# Patient Record
Sex: Female | Born: 1970 | Race: White | Hispanic: No | Marital: Married | Smoking: Never smoker
Health system: Southern US, Community
[De-identification: ages and names within clinical notes are randomized; demographics above are authoritative.]

## PROBLEM LIST (undated history)

## (undated) ENCOUNTER — Inpatient Hospital Stay (HOSPITAL_COMMUNITY): Payer: PRIVATE HEALTH INSURANCE

## (undated) DIAGNOSIS — G44009 Cluster headache syndrome, unspecified, not intractable: Secondary | ICD-10-CM

## (undated) DIAGNOSIS — E7800 Pure hypercholesterolemia, unspecified: Secondary | ICD-10-CM

## (undated) DIAGNOSIS — I471 Supraventricular tachycardia, unspecified: Secondary | ICD-10-CM

## (undated) DIAGNOSIS — B009 Herpesviral infection, unspecified: Secondary | ICD-10-CM

## (undated) DIAGNOSIS — IMO0002 Reserved for concepts with insufficient information to code with codable children: Secondary | ICD-10-CM

## (undated) HISTORY — PX: OTHER SURGICAL HISTORY: SHX169

## (undated) HISTORY — DX: Reserved for concepts with insufficient information to code with codable children: IMO0002

## (undated) HISTORY — DX: Pure hypercholesterolemia, unspecified: E78.00

## (undated) HISTORY — PX: DILATION AND CURETTAGE OF UTERUS: SHX78

## (undated) HISTORY — PX: CARDIAC CATHETERIZATION: SHX172

## (undated) HISTORY — DX: Supraventricular tachycardia: I47.1

## (undated) HISTORY — PX: COLPOSCOPY: SHX161

## (undated) HISTORY — DX: Cluster headache syndrome, unspecified, not intractable: G44.009

## (undated) HISTORY — PX: KNEE SURGERY: SHX244

## (undated) HISTORY — DX: Supraventricular tachycardia, unspecified: I47.10

## (undated) HISTORY — DX: Herpesviral infection, unspecified: B00.9

---

## 1997-12-23 ENCOUNTER — Other Ambulatory Visit: Admission: RE | Admit: 1997-12-23 | Discharge: 1997-12-23 | Payer: Self-pay | Admitting: Gynecology

## 1998-06-12 DIAGNOSIS — IMO0002 Reserved for concepts with insufficient information to code with codable children: Secondary | ICD-10-CM

## 1998-06-12 HISTORY — DX: Reserved for concepts with insufficient information to code with codable children: IMO0002

## 1998-08-17 ENCOUNTER — Other Ambulatory Visit: Admission: RE | Admit: 1998-08-17 | Discharge: 1998-08-17 | Payer: Self-pay | Admitting: Gynecology

## 1998-09-21 ENCOUNTER — Other Ambulatory Visit: Admission: RE | Admit: 1998-09-21 | Discharge: 1998-09-21 | Payer: Self-pay | Admitting: Gynecology

## 1999-02-03 ENCOUNTER — Other Ambulatory Visit: Admission: RE | Admit: 1999-02-03 | Discharge: 1999-02-03 | Payer: Self-pay | Admitting: Gynecology

## 1999-08-18 ENCOUNTER — Other Ambulatory Visit: Admission: RE | Admit: 1999-08-18 | Discharge: 1999-08-18 | Payer: Self-pay | Admitting: Gynecology

## 2000-01-04 ENCOUNTER — Other Ambulatory Visit: Admission: RE | Admit: 2000-01-04 | Discharge: 2000-01-04 | Payer: Self-pay | Admitting: Gynecology

## 2000-07-20 ENCOUNTER — Inpatient Hospital Stay (HOSPITAL_COMMUNITY): Admission: AD | Admit: 2000-07-20 | Discharge: 2000-07-22 | Payer: Self-pay | Admitting: Gynecology

## 2000-08-31 ENCOUNTER — Other Ambulatory Visit: Admission: RE | Admit: 2000-08-31 | Discharge: 2000-08-31 | Payer: Self-pay | Admitting: Gynecology

## 2001-06-27 ENCOUNTER — Other Ambulatory Visit: Admission: RE | Admit: 2001-06-27 | Discharge: 2001-06-27 | Payer: Self-pay | Admitting: Gynecology

## 2002-02-27 ENCOUNTER — Other Ambulatory Visit: Admission: RE | Admit: 2002-02-27 | Discharge: 2002-02-27 | Payer: Self-pay | Admitting: Gynecology

## 2002-09-19 ENCOUNTER — Inpatient Hospital Stay (HOSPITAL_COMMUNITY): Admission: AD | Admit: 2002-09-19 | Discharge: 2002-09-21 | Payer: Self-pay | Admitting: Gynecology

## 2002-11-05 ENCOUNTER — Other Ambulatory Visit: Admission: RE | Admit: 2002-11-05 | Discharge: 2002-11-05 | Payer: Self-pay | Admitting: Gynecology

## 2003-11-11 ENCOUNTER — Other Ambulatory Visit: Admission: RE | Admit: 2003-11-11 | Discharge: 2003-11-11 | Payer: Self-pay | Admitting: Gynecology

## 2004-12-02 ENCOUNTER — Ambulatory Visit: Payer: Self-pay | Admitting: Internal Medicine

## 2004-12-09 ENCOUNTER — Other Ambulatory Visit: Admission: RE | Admit: 2004-12-09 | Discharge: 2004-12-09 | Payer: Self-pay | Admitting: Gynecology

## 2004-12-23 ENCOUNTER — Ambulatory Visit: Payer: Self-pay | Admitting: Internal Medicine

## 2004-12-28 ENCOUNTER — Ambulatory Visit (HOSPITAL_COMMUNITY): Admission: AD | Admit: 2004-12-28 | Discharge: 2004-12-30 | Payer: Self-pay | Admitting: Internal Medicine

## 2004-12-29 ENCOUNTER — Ambulatory Visit: Payer: Self-pay | Admitting: Internal Medicine

## 2005-01-12 ENCOUNTER — Ambulatory Visit: Payer: Self-pay | Admitting: Internal Medicine

## 2005-01-27 ENCOUNTER — Ambulatory Visit: Payer: Self-pay | Admitting: Internal Medicine

## 2005-12-10 IMAGING — CT CT ABDOMEN W/O CM
1 series · 15 of 32 positions shown, 19 images · non-contrast
Comparison: None

ABDOMEN CT WITHOUT CONTRAST:

CLINICAL DATA: Status post catheterization, right groin pain
TECHNIQUE: Multidetector CT imaging of the abdomen and pelvis was performed
following the standard protocol without oral or intravenous contrast.

[Series 2: routine abdomen · axial · 0.67mm/px · z∈[-322,+28]mm · 15 of 79 slices shown, 19 images]
[im 6/79  soft-tissue]
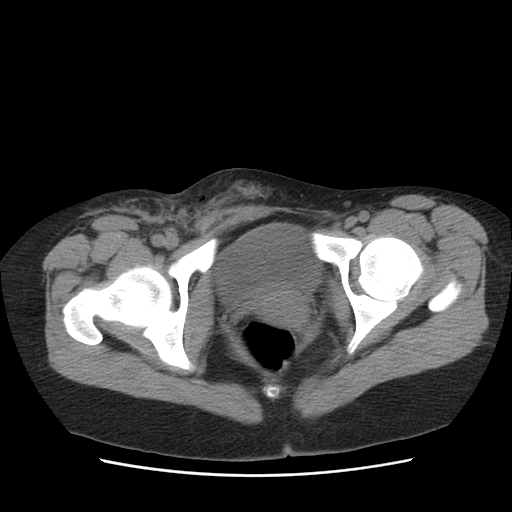
[im 6/79  bone]
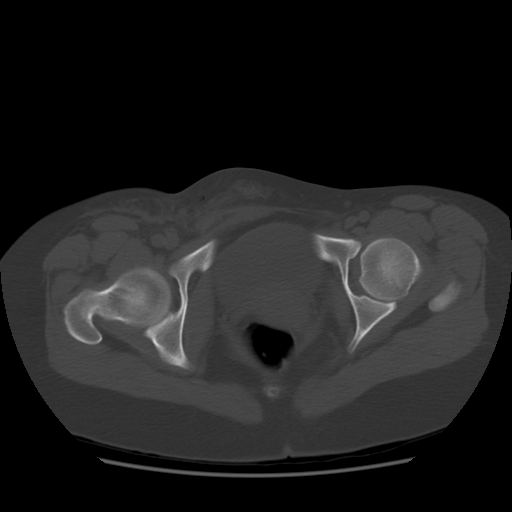
[im 11/79  soft-tissue]
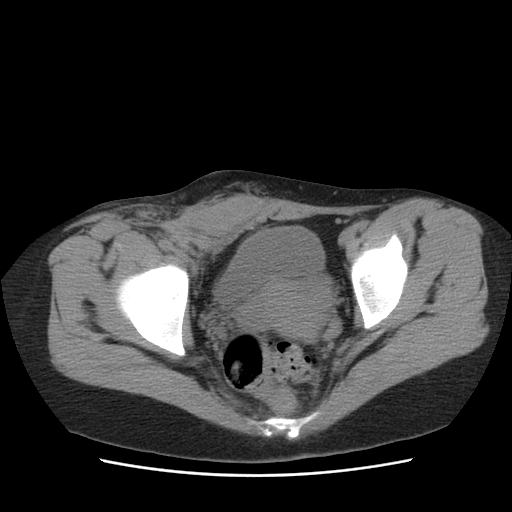
[im 16/79  soft-tissue]
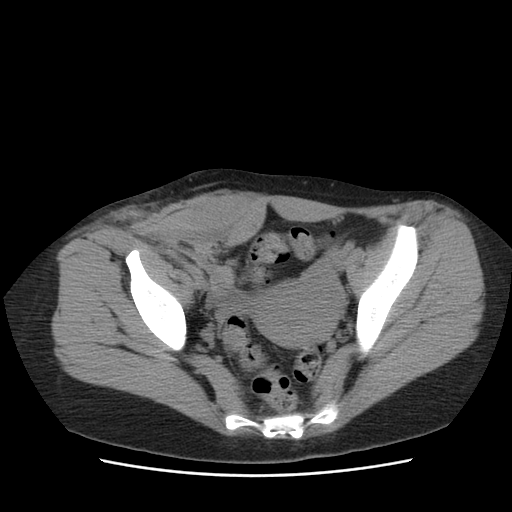
[im 23/79  soft-tissue]
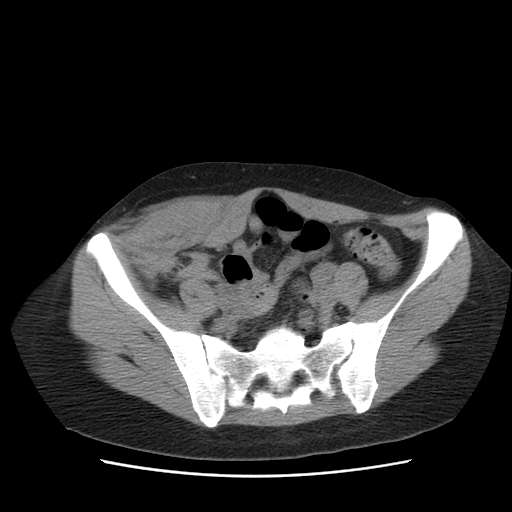
[im 28/79  soft-tissue]
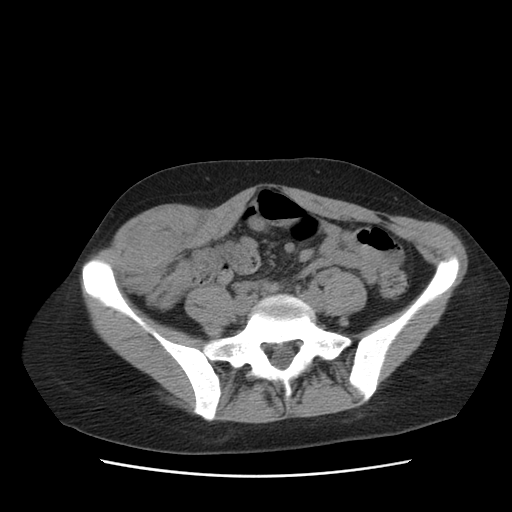
[im 33/79  soft-tissue]
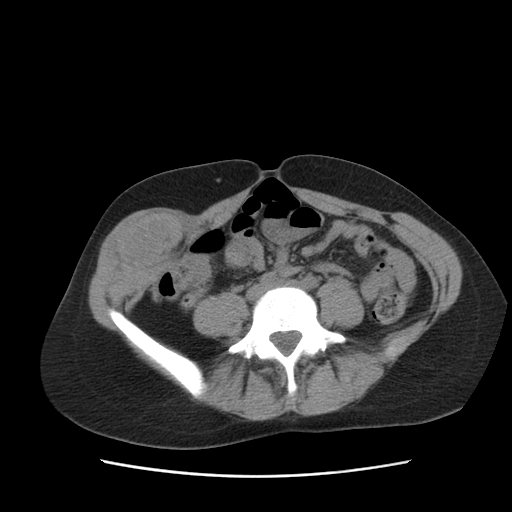
[im 41/79  soft-tissue]
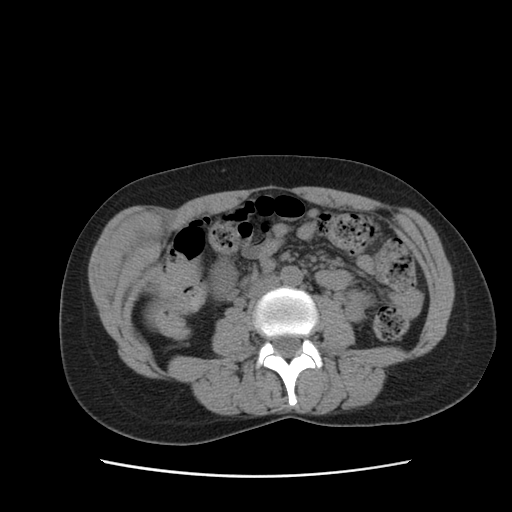
[im 46/79  soft-tissue]
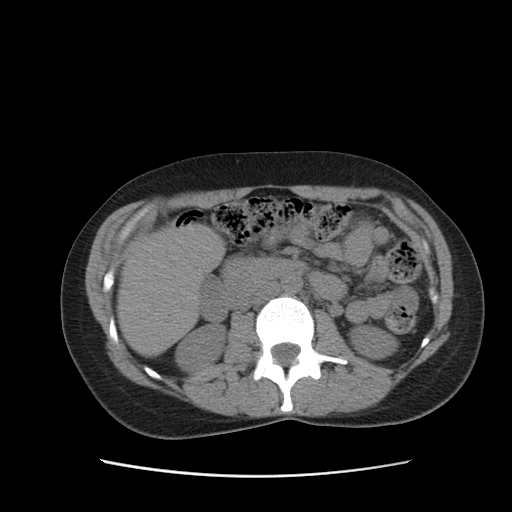
[im 51/79  soft-tissue]
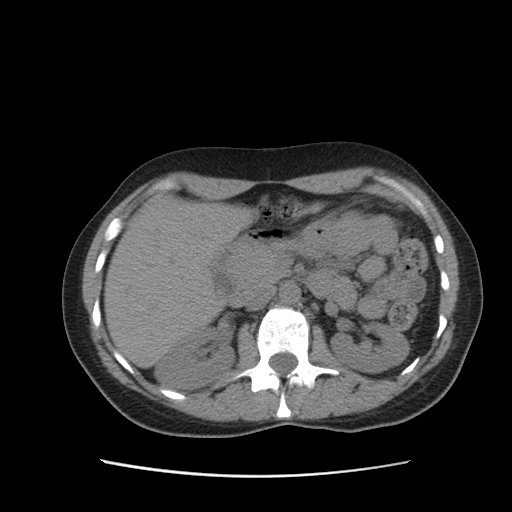
[im 51/79  bone]
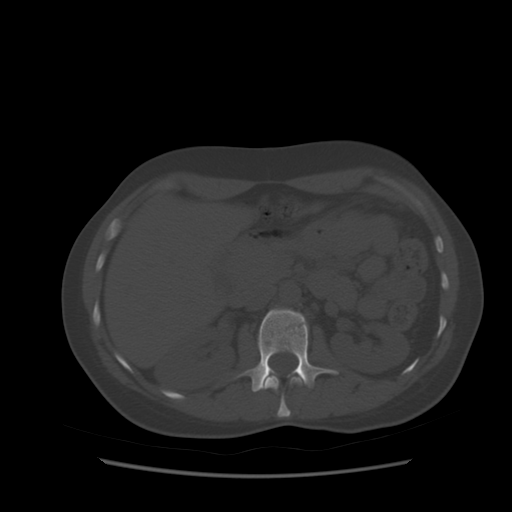
[im 56/79  soft-tissue]
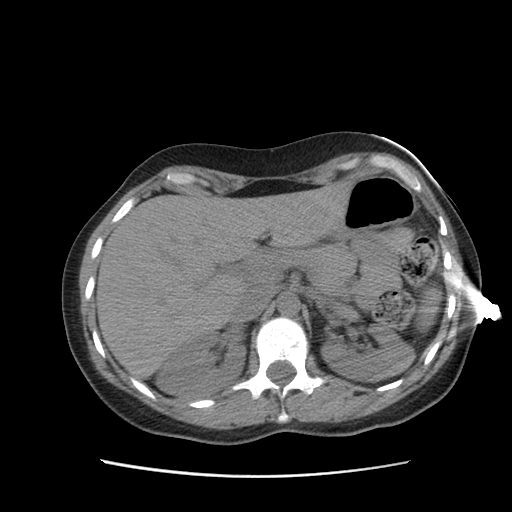
[im 63/79  soft-tissue]
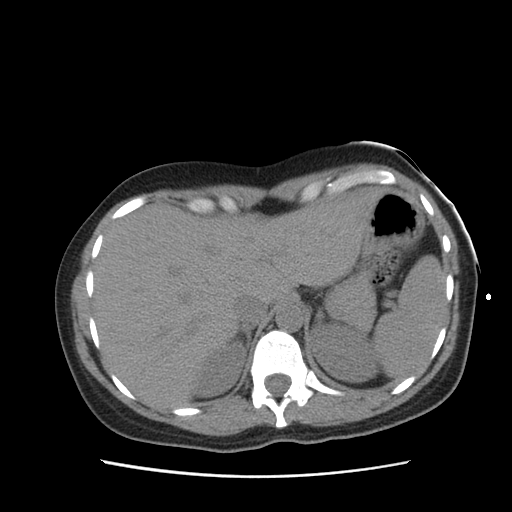
[im 68/79  soft-tissue]
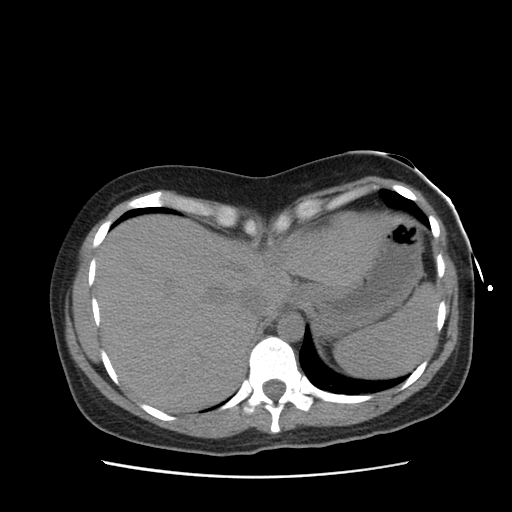
[im 68/79  lung]
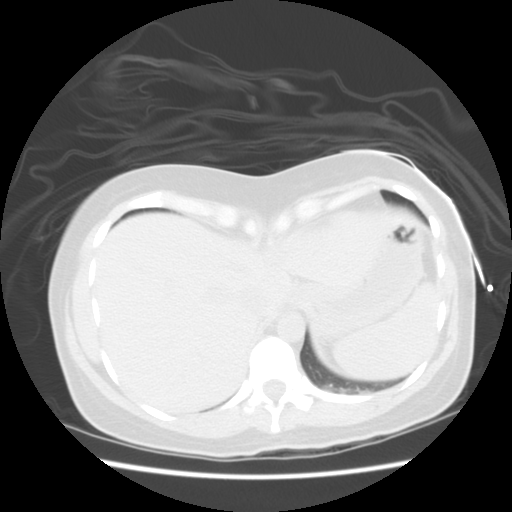
[im 71/79  lung]
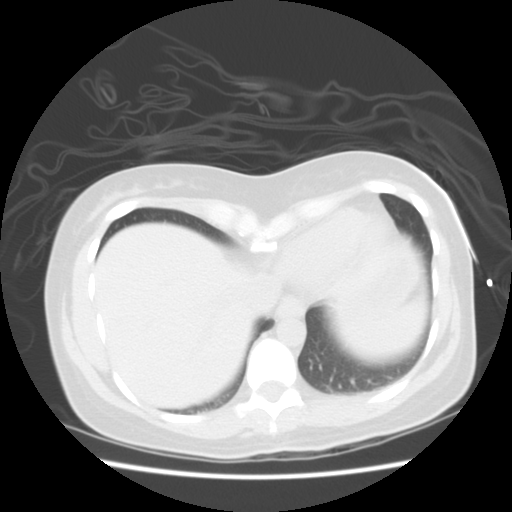
[im 73/79  soft-tissue]
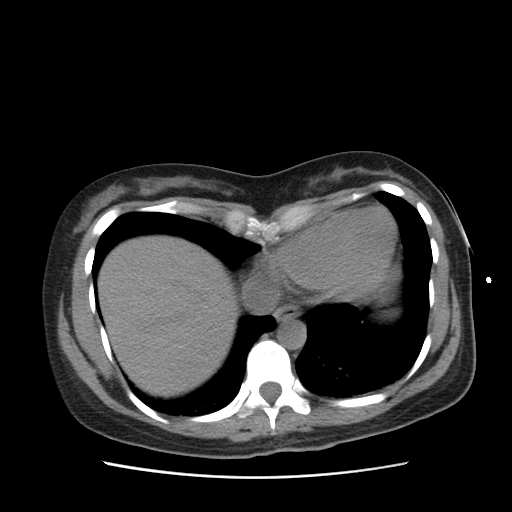
[im 73/79  lung]
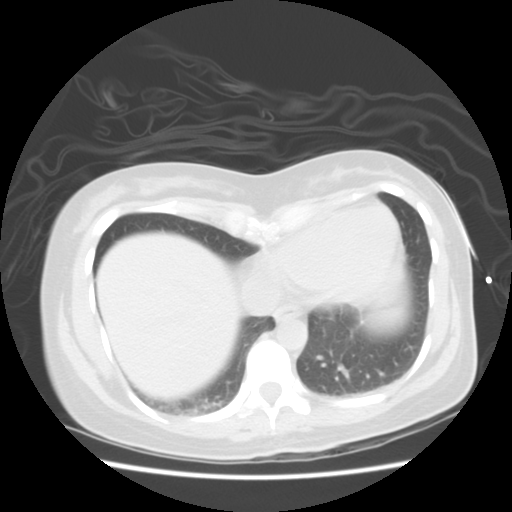
[im 76/79  lung]
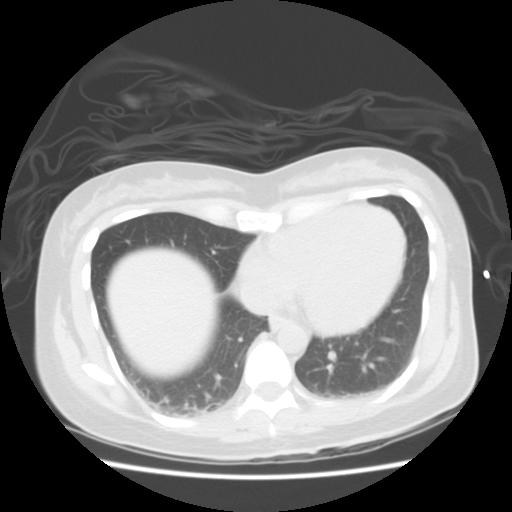

[15 of 32 positions shown; findings below may reference images not displayed]

FINDINGS: There is a hematoma noted within the right external oblique muscle
beginning just below the right inferior rib margin extending into the pelvis. No
retroperitoneal bleed. The solid organs have unremarkable uninfused appearance.
No free fluid or free air. Bowel and gallbladder are grossly unremarkable.
IMPRESSION: Hematoma noted within the right external oblique muscle extending into the
pelvis. 

PELVIS CT WITHOUT CONTRAST:
FINDINGS: The hematoma within the right external oblique muscle continues down
to near the pubic symphysis. Stranding is noted within the subcutaneous soft
tissues of the right groin. No free fluid in the pelvis. Pelvic structures
unremarkable.
IMPRESSION: Hematoma within the right external oblique muscle. Stranding in the subcutaneous
soft tissues of the right groin.

These results were discussed with Egger with [facility].

## 2006-01-03 ENCOUNTER — Other Ambulatory Visit: Admission: RE | Admit: 2006-01-03 | Discharge: 2006-01-03 | Payer: Self-pay | Admitting: Gynecology

## 2007-01-16 ENCOUNTER — Other Ambulatory Visit: Admission: RE | Admit: 2007-01-16 | Discharge: 2007-01-16 | Payer: Self-pay | Admitting: Gynecology

## 2007-02-03 ENCOUNTER — Inpatient Hospital Stay (HOSPITAL_COMMUNITY): Admission: AD | Admit: 2007-02-03 | Discharge: 2007-02-03 | Payer: Self-pay | Admitting: Obstetrics and Gynecology

## 2008-01-12 ENCOUNTER — Ambulatory Visit (HOSPITAL_COMMUNITY): Admission: RE | Admit: 2008-01-12 | Discharge: 2008-01-12 | Payer: Self-pay | Admitting: Gynecology

## 2008-02-12 ENCOUNTER — Encounter: Payer: Self-pay | Admitting: Gynecology

## 2008-02-12 ENCOUNTER — Ambulatory Visit: Payer: Self-pay | Admitting: Gynecology

## 2008-02-12 ENCOUNTER — Other Ambulatory Visit: Admission: RE | Admit: 2008-02-12 | Discharge: 2008-02-12 | Payer: Self-pay | Admitting: Gynecology

## 2008-11-22 ENCOUNTER — Emergency Department (HOSPITAL_BASED_OUTPATIENT_CLINIC_OR_DEPARTMENT_OTHER): Admission: EM | Admit: 2008-11-22 | Discharge: 2008-11-22 | Payer: Self-pay | Admitting: Emergency Medicine

## 2008-11-22 ENCOUNTER — Ambulatory Visit: Payer: Self-pay | Admitting: Interventional Radiology

## 2009-02-17 ENCOUNTER — Encounter: Payer: Self-pay | Admitting: Gynecology

## 2009-02-17 ENCOUNTER — Other Ambulatory Visit: Admission: RE | Admit: 2009-02-17 | Discharge: 2009-02-17 | Payer: Self-pay | Admitting: Gynecology

## 2009-02-17 ENCOUNTER — Ambulatory Visit: Payer: Self-pay | Admitting: Gynecology

## 2009-03-27 ENCOUNTER — Ambulatory Visit (HOSPITAL_COMMUNITY): Admission: RE | Admit: 2009-03-27 | Discharge: 2009-03-27 | Payer: Self-pay | Admitting: Gynecology

## 2009-05-30 ENCOUNTER — Emergency Department (HOSPITAL_BASED_OUTPATIENT_CLINIC_OR_DEPARTMENT_OTHER): Admission: EM | Admit: 2009-05-30 | Discharge: 2009-05-30 | Payer: Self-pay | Admitting: Emergency Medicine

## 2009-05-30 ENCOUNTER — Ambulatory Visit: Payer: Self-pay | Admitting: Diagnostic Radiology

## 2010-03-19 ENCOUNTER — Ambulatory Visit (HOSPITAL_COMMUNITY): Admission: RE | Admit: 2010-03-19 | Discharge: 2010-03-19 | Payer: Self-pay | Admitting: Gynecology

## 2010-03-24 ENCOUNTER — Ambulatory Visit: Payer: Self-pay | Admitting: Gynecology

## 2010-05-09 ENCOUNTER — Ambulatory Visit: Payer: Self-pay | Admitting: Gynecology

## 2010-05-12 IMAGING — CR DG CHEST 2V
2 series · 2 of 2 positions shown · non-contrast
Comparison: None.

CLINICAL DATA: Cough, shortness of breath, chest pain and back
pain.

CHEST - 2 VIEW

[w chest pa]
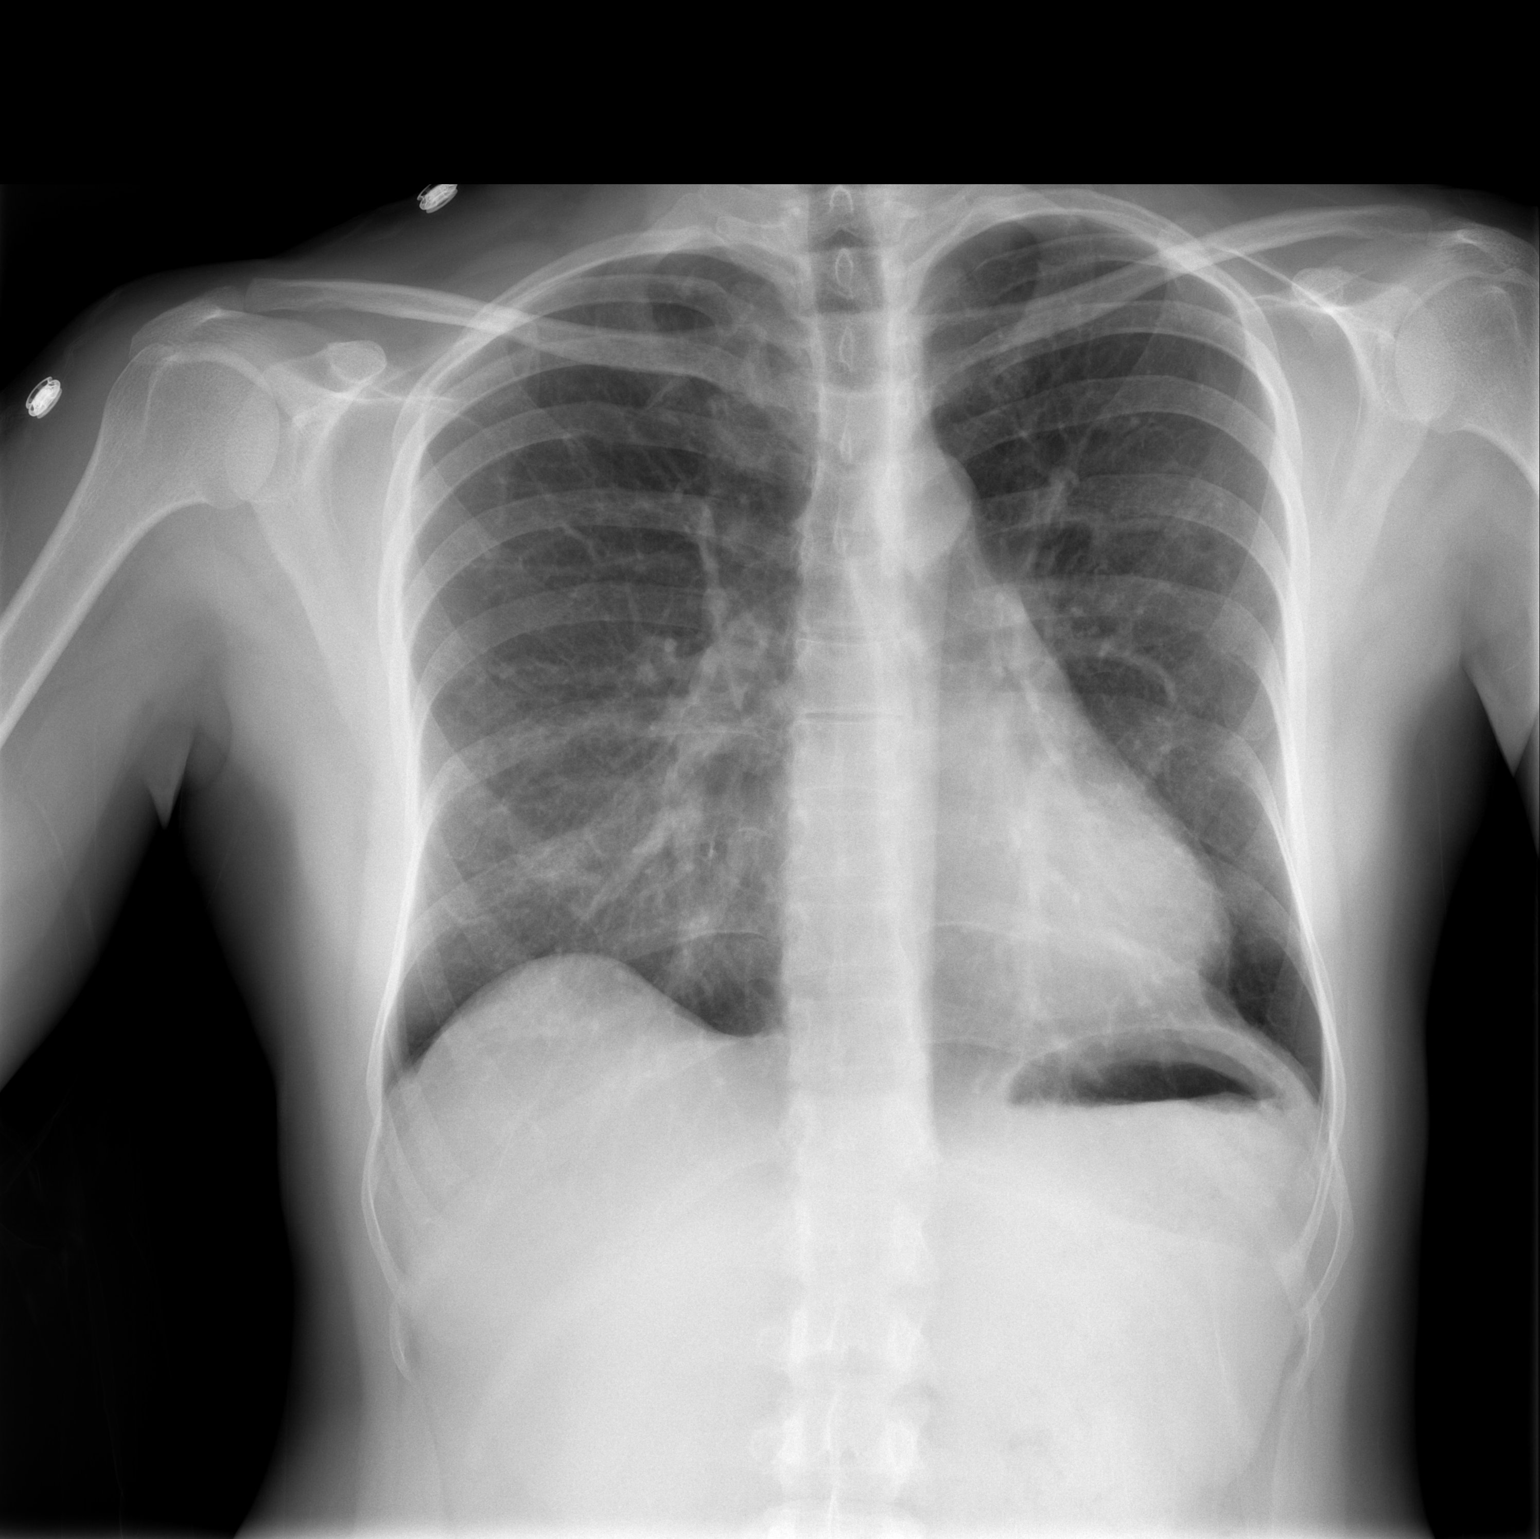

[w chest lat]
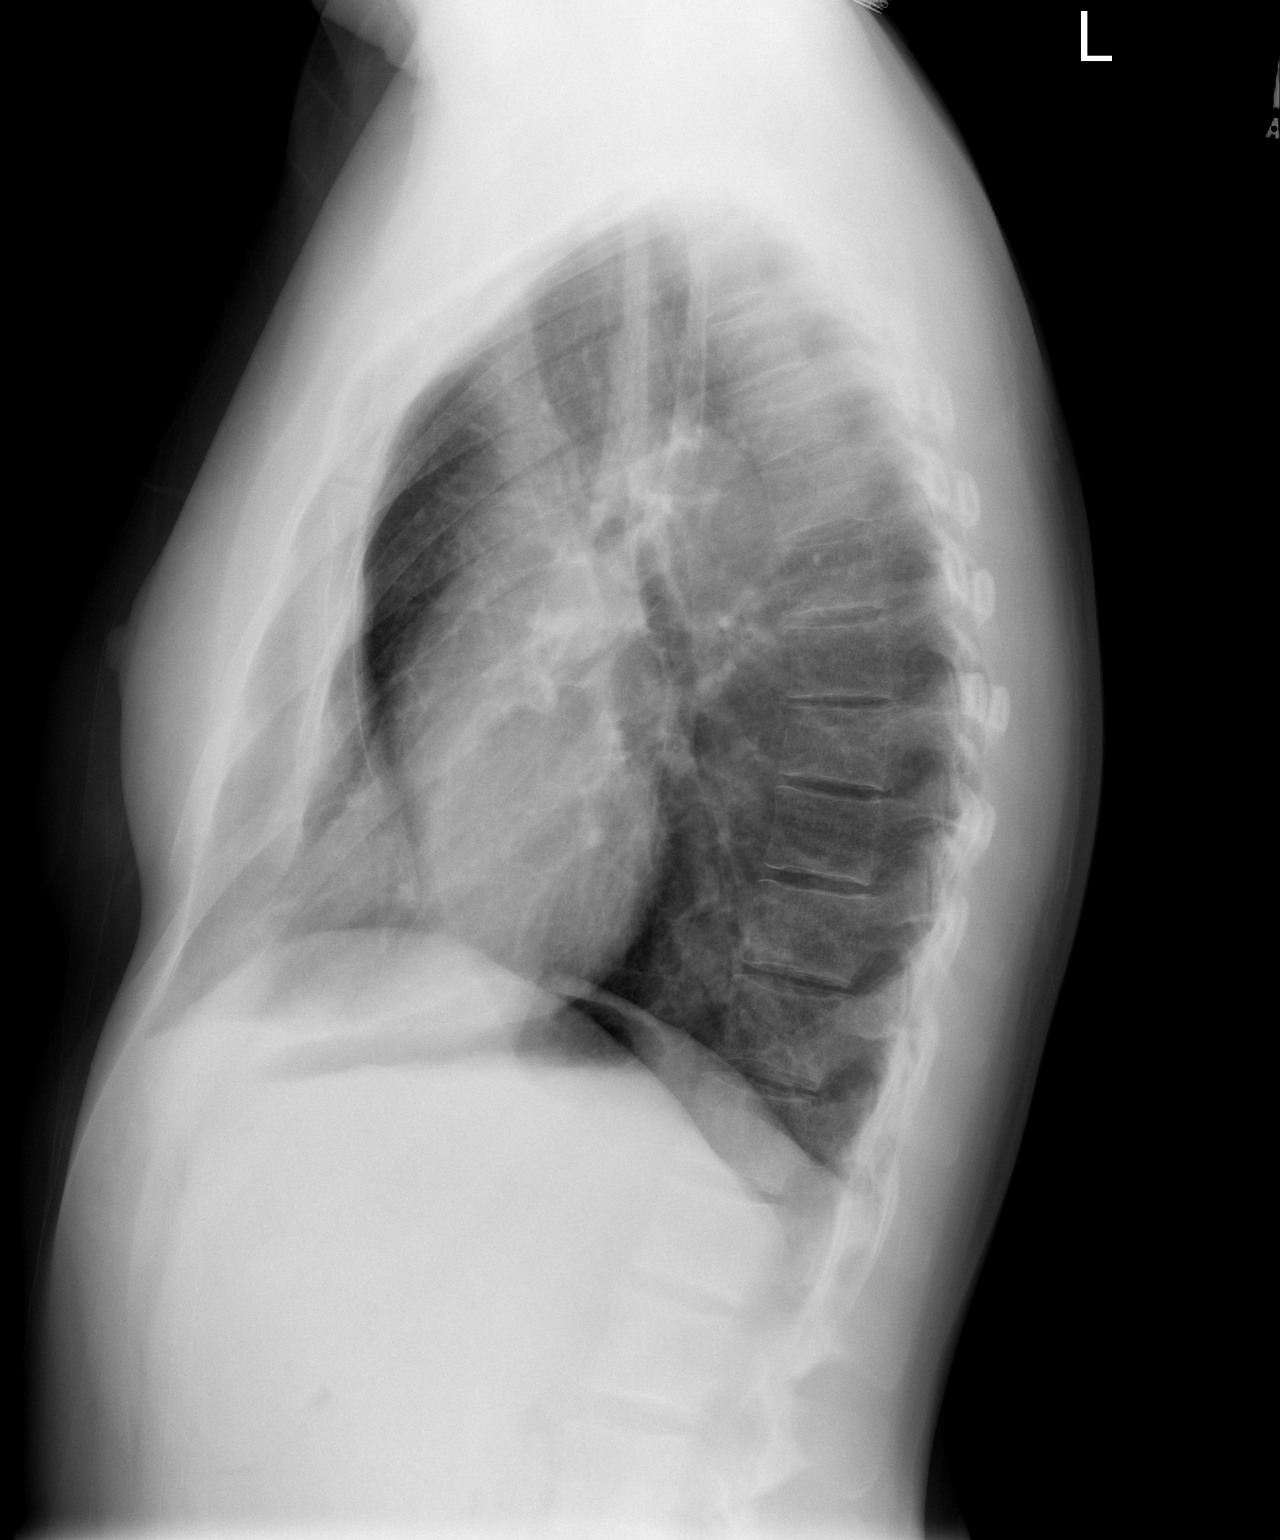

[2 of 2 positions shown; findings below may reference images not displayed]

FINDINGS: Trachea is midline.  Heart size normal.  Lungs are clear.
No pleural fluid.  Pectus deformity is noted.
IMPRESSION: No acute findings.

## 2010-10-28 NOTE — H&P (Signed)
[facility]  Patient:    Robin Potter, Robin Potter                       MRN: [id number] Adm. Date:  07/20/00 Attending:  Marcial Pacas P. Fontaine, M.D.                         History and Physical  CHIEF COMPLAINT:              1. Pregnancy at term.                               2. 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                               3. Advanced cervical change.  HISTORY OF PRESENT ILLNESS:   A 40 year old, G3, P1, AB1, female at [redacted] weeks gestation found on recent exam to be 3 cm, 50%, -1 station.  The patient has a history of HSV, no recent outbreaks, and is admitted for induction due to increasing maternal discomfort as well as to achieve an HSV-free window.  PAST MEDICAL HISTORY:         Uncomplicated.  PAST SURGICAL HISTORY:        Includes wisdom teeth extraction and D&C.  ALLERGIES:                    SULFA.  REVIEW OF SYSTEMS:            Noncontributory.  SOCIAL HISTORY:               Noncontributory.  FAMILY HISTORY:               Noncontributory.  ADMISSION PHYSICAL EXAMINATION:  VITAL SIGNS:                  Afebrile.  Vital signs are stable.  HEENT:                        Normal.  LUNGS:                        Clear.  CARDIAC:                      Regular rate.  No rubs, murmurs, or gallops.  ABDOMEN:                      Gravid vertex fetus.  PELVIC:                       At 3 cm, 50% effaced, -1 station.  ASSESSMENT:                   A 40 year old, gravida 3, para 1, abortus 1 female at term gestation with advanced cervical changed for induction due to increasing maternal discomfort as well as to achieve an herpes simplex virus-free window.  The patient is beta Streptococcus negative.  Plan is for Pitocin augmentation, artificial rupture of membranes, and  delivery as discussed with the patient and her husband, both of whom agree. DD:  07/18/00 TD:  07/18/00 Job:  [id number]

## 2010-10-28 NOTE — Op Note (Signed)
Robin Potter, Robin Potter NO.:  [id number]   MEDICAL RECORD NO.:  [id number]          PATIENT TYPE:  OIB   LOCATION:  2899                         FACILITY:  [facility]   PHYSICIAN:  Doylene Canning. Ladona Ridgel, M.D.  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   INTRODUCTION:  The patient is a 40 year old woman with a several year  history of tachy palpitations. They start and stop suddenly.  They sometimes  resolve with vagal maneuvers. She has not had good control with medical  therapy. She is now referred for electrophysiologic study and catheter  ablation.  Of note, the patient has had documented SVT at rates of over 200  beats a minute.   PROCEDURE:  After informed consent was obtained, the patient was taken to  the diagnostic EP lab in fasting state.  After the usual preparation and  draping, intravenous fentanyl and Midazolam was given for sedation. A 6-  [place] hexapolar catheter was inserted percutaneously in the right jugular  vein and advanced to the coronary sinus. A 5-French quadripolar catheter was  inserted percutaneously in the right femoral vein and advanced to the His  bundle region. A 5-French quadripolar catheter was inserted percutaneously  in the right femoral vein and advanced to the right ventricle. After  measurement of the basic intervals, rapid ventricular pacing was carried out  from the RV apex at base drive cycle length of 191 milliseconds. During  rapid ventricular pacing, the atrial activation was nondecremental and  eccentric with earliest atrial activation along the distal coronary sinus  approximately three to four o'clock on the mitral annulus when viewed in the  LAO projection. Additional pacing down to 380  milliseconds resulted in the  initiation of SVT. During SVT the cycle length was 450 milliseconds. PVCs  placed the time of His bundle refractoriness clearly pre-excited the atrium.  The atrial activation during SVT was earliest in the distal coronary sinus.  V pacing terminated the tachycardia. With all the above documented,  additional rapid atrial pacing was carried out from the coronary sinus and  this demonstrated no pre-excitation despite pacing from this position  immediately next to the accessory pathway. Rapid atrial pacing was carried  out from the coronary sinus as well as the high right atrium and stepwise  decreased down to 450 milliseconds where AV Wenckebach was observed. During  rapid atrial pacing, the PR interval became equal to the RR interval but  there was no inducible SVT. Programmed atrial stimulation was carried out  from the coronary sinus as well as the high right atrium at base drive cycle  length of 478 milliseconds.  The S1-S2 interval stepwise decreased from 540  milliseconds down to 310 milliseconds where the AV node ERP was observed.  During programmed atrial stimulation there was an AH jump and a rare echo  beat noted but  no inducible SVT. At this point the ablation catheter was  inserted percutaneously through the right femoral artery and advanced  retrograde across the aortic valve into the left ventricle. The ablation  catheter was flipped onto the mitral annulus. Mapping was carried out. At a  site at approximately 4 o'clock on the mitral annulus, the local V and A on  the ablation catheter were fused together during ventricular pacing.  At  this location a single RF energy application was delivered resulting in  termination of accessory pathway conduction. Following this, additional  rapid atrial and ventricular pacing was carried out and rapid atrial pacing  and ventricular pacing was carried out during the infusion of isoproterenol.  Despite this,  there was no recurrent accessory pathway conduction and no  additional SVT. The catheters were then removed. Hemostasis was assured, the  patient was returned to her room in satisfactory condition.   COMPLICATIONS:  There were no immediate procedure complications.   RESULTS:  A.  Baseline ECG.  The baseline ECG demonstrates normal sinus  rhythm with normal axis and intervals.  B.  Baseline intervals.  Sinus node cycle length was 837 milliseconds, the  PR interval 127 milliseconds, the HV interval 42 milliseconds, QRS duration  90 milliseconds.  C.  Rapid ventricular pacing.  Rapid ventricular pacing was carried out from  the RV apex at paced cycle length of 600 milliseconds. Prior to catheter  ablation there was eccentric nondecremental atrial activation and inducible  SVT. Following ablation, there was VA dissociation present at 600  milliseconds.  D.  Programmed ventricular stimulation. Programmed ventricular stimulation  was carried out from the RV apex at base drive cycle length of 119  milliseconds.  The S1-S1 interval was stepwise decreased down to 300  milliseconds resulting in the retrograde accessory pathway ERP. During  programmed ventricular stimulation, the atrial activation was nondecremental  and eccentric. E.  Rapid atrial pacing. Rapid atrial pacing was carried out  from the coronary sinus and the high atrium at paced cycle length of 600  milliseconds and stepwise decreased down to 450 milliseconds where AV  Wenckebach was observed. During rapid atrial pacing, the PR interval was  equal to but not greater than the RR interval. There was no inducible SVT.  F.  Programmed atrial stimulation.  Programmed atrial stimulation was  carried out the coronary sinus as well as the high right atrium at basic  drive cycle length of 147 milliseconds.  The S1-S2 interval was stepwise  decreased from 540 milliseconds down to 310 milliseconds where AV node ERP was observed. During  programmed atrial stimulation there were echo beats but  no inducible SVT.  G.  Arrhythmias observed.  1.  AV reentrant tachycardia.  Initiation with rapid ventricular pacing.      Duration was sustained.  Cycle length 450 milliseconds.  Method of      termination was with ventricular pacing.      1.  Mapping. Mapping of the of the mitral annulus demonstrated the usual          size and orientation.          1.  RF energy application.  A total of one single RF energy              application was delivered which resulted in termination of              accessory pathway conduction and rendered the tachycardia  noninducible.   CONCLUSIONS:  This study demonstrates successful electrophysiologic study  and RF catheter ablation of AV re-entrant tachycardia which was utilized to  concealed left lateral accessory pathway. There were no immediate procedure  complications.       GWT/MEDQ  D:  12/28/2004  T:  12/28/2004  Job:  [id number]   cc:   Armanda Magic, M.D.  [address]  [place], [place] [address]  Fax: [phone]   Dario Guardian, M.D.  Fax: [phone]

## 2010-10-28 NOTE — H&P (Signed)
   NAME:  Robin Potter, Robin Potter                        ACCOUNT NO.:  [id number]   MEDICAL RECORD NO.:  [id number]                   PATIENT TYPE:  MAT   LOCATION:  MATC                                 [facility]ILITY:  [facility]   PHYSICIAN:  Ivor Costa. Farrel Gobble, M.D.              DATE OF BIRTH:  December 12, 1970   DATE OF ADMISSION:  DATE OF DISCHARGE:                                HISTORY & PHYSICAL   CHIEF COMPLAINT:  Term pregnancy with favorable cervix.   HISTORY OF PRESENT ILLNESS:  The patient is a 40 year old Gravida IV, Para  II, 0/1/2 with a last menstrual period of December 24, 2001. Estimated date of  confinement is Maralyn 21, 2004. Estimated gestational age is 55 and 2/7th  weeks. She presents to labor and delivery for elective induction of labor  secondary to favorable cervix status. In addition, the patient has a history  of herpes II and has not had an outbreak in the recent past. Therefore, we  elected to do an induction at this point. Her pregnancy has been complicated  only by the above. She reports good fetal movement. No vaginal bleeding. No  contractions. Otherwise, is without complaints.   OB/GYN HISTORY:  She is A+, antibody negative, Rubella immune, RPR non-  reactive, HIV non-reactive, hepatitis B surface antigen non-reactive. AFP  within normal limits. GBS negative. Please refer to the [facility] for  complete history.   PHYSICAL EXAMINATION:  GENERAL: A well appearing Gravida in no acute  distress.  VITAL SIGNS: Weight 154.5 pounds. This is a 21 pound weight gain. Blood  pressure is 120/72. Urine dip was negative.  HEART: Regular rate and rhythm.  LUNGS: Clear to auscultation and percussion.  ABDOMEN: Gravid with fundal height of 38.  GYN: She is 350 and high, although the vertex is reachable.  EXTREMITIES: Trace edema.   ASSESSMENT:  Favorable cervix at 38 plus weeks for a AROM and Pitocin. The  patient will present on the morning of Kaylinn 9, 2004 for the above.                                      Ivor Costa. Farrel Gobble, M.D.    THL/MEDQ  D:  09/18/2002  T:  09/19/2002  Job:  [id number]

## 2010-10-28 NOTE — Discharge Summary (Signed)
NAMEMAEBELL, LYVERS NO.:  [id number]   MEDICAL RECORD NO.:  [id number]          PATIENT TYPE:  OIB   LOCATION:  2012                         FACILITY:  [facility]   PHYSICIAN:  Doylene Canning. Ladona Ridgel, M.D.  DATE OF BIRTH:  1970/10/08   DATE OF ADMISSION:  12/28/2004  DATE OF DISCHARGE:  12/30/2004                                 DISCHARGE SUMMARY   DISCHARGE DIAGNOSES:  1.  Discharging day number two, status post electrophysiology      study/radiofrequency catheter ablation of atrial ventricular re-entry      tachycardia.  The patient had inducible  atrial ventricular re-entry      tachycardia by way of a concealed left lateral accessory pathway.  After      ablation, no inducible supraventricular tachycardia.  2.  Post-procedure right groin hematoma.  3.  A CT of the pelvis demonstrated a hematoma of the right external oblique      muscle, extending from just below the right inferior rib to the pubic      symphysis.  No retroperitoneal bleed.  4.  Hemoglobin stable at 9.9 for the last 24 hours.  On the morning of December 29, 2004, the hemoglobin was 9.9.  On the morning of December 30, 2004,      hemoglobin was 9.9.  5.  Hypotension post-procedure, secondary to acute blood loss anemia.  The      patient's blood pressure on admission was 103 systolic.  The patient's      blood pressure at discharge was 87.  6.  Tachy-palpitations, symptomatic, with fatigue and pre-syncope.  The      tachy-arrhythmias have always been self-limited.  The patient has never      needed hospitalization with challenge adenosine.   PROCEDURE:  On December 28, 2004, electrophysiology study with successful  radiofrequency catheter ablation of atrial ventricular re-entry tachycardia  by way of concealed left lateral accessory pathway, Dr. Doylene Canning. Ladona Ridgel,  practitioner.   DISPOSITION:  Discharging day two after radiofrequency catheter ablation  procedure.  The patient did have in the immediate  post-procedure period pain  and tenderness at the right groin, extending up to the right lower quadrant.  A CT scan was done, which showed no peritoneal bleed, but evidence of  hematoma which stretched from the right subcostal space to the pubic  symphysis.  The patient had a hemoglobin of 12.6 on preoperative blood  studies, which were on December 23, 2004, with acute blood loss anemia.  The  hemoglobin had dropped to 9.9 and has remained stable at 9.9 for the last 24  hours prior to discharge.  The patient is having some hypotension, but  achieving 99% oxygen saturation on room air.  She continues to have  tenderness and pain at the right groin and in the right lower quadrant, but  it is controlled well with Percocet, and she will go home with this  medication.   DISCHARGE INSTRUCTIONS:  1.  The patient is unable to take iron supplementation, secondary to feeling      nauseated with  this, so it is suggested that the patient eat a diet      sufficient in red meat.  2.  The patient is able to shower.  3.  She is to call [phone] if she experiences any increased pain or      increased swelling at the catheter site.  4.  She is asked not to drive for the next two days.  5.  She is not to engage in any heavy lifting for the next two weeks.   DISCHARGE MEDICATIONS:  1.  For pain she has Percocet 5/325, one to two tab q.4-6h. p.r.n.  2.  She goes home on enteric-coated aspirin 81 mg daily.  She will start      that on Monday, January 02, 2005.  3.  Of note, if she plans dental work, even just teeth cleaning up to and      through December 2006, or minor surgery, call [phone] for antibiotic      coverage.   FOLLOWUP:  1.  She will come to the [facility] on Thursday,      January 12, 2005, at 4 p.m.  2.  She will see Dr. Lewayne Bunting on Friday, January 27, 2005, at 12:15 p.m.   HISTORY OF PRESENT ILLNESS:  Ms. Cotta is a 40 year old female.  She has  a long history of  tachy palpitations.  These symptoms began when she was a  teenager.  They have increased in frequency and severity.  She has had in  the past Cardizem, Toprol and atenolol as treatment, but she has symptoms  even on these medications.  She feels fatigued and pre-syncopal when she has  heart racing.  She has recently worn a monitor, which demonstrates narrow  complex tachycardia at greater than 200 beats a minute.  The patient says  these spells last as long as two hours.  They are self-limited.  Sometimes  vagal maneuvers work.  Sometimes they do not.  She also has chest pressure  and back discomfort with these palpitations.   PAST MEDICAL HISTORY:  1.  Also notable for a history of migraine headaches for the last eight      years.  2.  History of hyperlipidemia.  Otherwise her medical history is free of any      chronic disease.   The patient has been seen by Dr. Lewayne Bunting.  The risks and benefits and  expectations of the electrophysiology study and catheter ablation have been  discussed.  The patient would like to proceed.   HOSPITAL COURSE:  The patient presented on December 28, 2004, for an elective  electrophysiology study and probable radiofrequency catheter ablation.  A  concealed left lateral accessory pathway was revealed at electrophysiology  study, and ablation carried out successfully.  This required a right femoral  artery approach, and after RF CA, there was no accessory pathway conduction  and no inducible SVT.  In the post-procedure period, the patient did develop  right groin pain which extended into the right lower quadrant.  A CT of the  pelvis as described above, showed no retroperitoneal bleed, but significant  right-sided hematoma.  The patient's hemoglobin has stabilized at 9.9.  She  discharges at that value.   She will start baby aspirin in three days, and has followup as dictated  above.      Debbora Lacrosse  GM/MEDQ  D:  12/30/2004  T:  12/30/2004  Job:   [id number]   cc:  Dario Guardian, M.D.  Fax: [phone]   Armanda Magic, M.D.  [address]  [place], [place] [address]  Fax: [phone]

## 2010-10-28 NOTE — Discharge Summary (Signed)
   NAME:  Robin Potter, Robin Potter                        ACCOUNT NO.:  [id number]   MEDICAL RECORD NO.:  [id number]                   PATIENT TYPE:  INP   LOCATION:  9122                                 [facility]ILITY[facility]   PHYSICIAN:  Ivor Costa. Farrel Gobble, M.D.              DATE OF BIRTH:  06-13-1970   DATE OF ADMISSION:  09/19/2002  DATE OF DISCHARGE:  09/21/2002                                 DISCHARGE SUMMARY   DISCHARGE DIAGNOSES:  1. Intrauterine pregnancy at 38+ weeks, delivered.  2. History of herpes simplex virus with no outbreak during pregnancy.  3. Status post spontaneous vaginal delivery.  4. Allergic reaction post delivery.   HISTORY:  A 40 year old female gravida 4, para 2 with an EDC of Donnetta 21,  2004.  Prenatal course was complicated by history of herpes type 2 with no  outbreak in the recent past.   HOSPITAL COURSE:  On Ryian 9, 2004, patient was admitted with a favorable  cervix at 38+ weeks for induction and subsequently underwent artificial  rupture of membranes.  Pitocin was started and underwent a spontaneous  vaginal delivery on Dai 9, 2004, of a female, Apgars of 9 and 9, weight 7  pounds 8 ounces.  There was no episiotomy.  First-degree laceration.  No  complications with delivery.  The patient did complain on Amyrah 10, 2004, of  a beginning redness at the buttock region, groin at the epidural site.  It  was whelpy.  No relief with Benadryl.  Anesthesiologist evaluated.  Given  topical cream.  She was started on Medrol dose pack and Claritin the day  prior.  Still itchy on Vergie 11, 2004, but was looking better, per her  husband's report.  There was noted to be erythematous rash buttocks, groin.  The patient otherwise was in satisfactory condition, thought stable for  discharge.   ACCESSORY CLINICAL FINDINGS:  Laboratories:  The patient is A+.  Rubella  immune.  On Kaydan 10, 2004, hemoglobin 10.4.   DISPOSITION:  The patient is discharged home.  She is to continue  the Medrol  Dose Pack.  She was treated with Allegra-D to take one twice a day.  She was  given __________one box, Tylox p.r.n. pain, Ambien 10 mg q.h.s. p.r.n.  sleep.  She was to call the office for followup in the a.m.  If no changes  continue to decrease the rash.  If no changes continue for improvement to  get her an allergy specialist appointment.     Susa Loffler, P.A.                    Ivor Costa. Farrel Gobble, M.D.    TSG/MEDQ  D:  10/24/2002  T:  10/24/2002  Job:  [id number]

## 2011-03-24 ENCOUNTER — Other Ambulatory Visit: Payer: Self-pay | Admitting: Gynecology

## 2011-03-24 DIAGNOSIS — Z1231 Encounter for screening mammogram for malignant neoplasm of breast: Secondary | ICD-10-CM

## 2011-03-27 ENCOUNTER — Encounter: Payer: Self-pay | Admitting: *Deleted

## 2011-03-27 DIAGNOSIS — B009 Herpesviral infection, unspecified: Secondary | ICD-10-CM | POA: Insufficient documentation

## 2011-03-27 DIAGNOSIS — I471 Supraventricular tachycardia: Secondary | ICD-10-CM | POA: Insufficient documentation

## 2011-03-27 DIAGNOSIS — E7800 Pure hypercholesterolemia, unspecified: Secondary | ICD-10-CM | POA: Insufficient documentation

## 2011-03-30 ENCOUNTER — Ambulatory Visit (INDEPENDENT_AMBULATORY_CARE_PROVIDER_SITE_OTHER): Payer: PRIVATE HEALTH INSURANCE | Admitting: Gynecology

## 2011-03-30 ENCOUNTER — Encounter: Payer: Self-pay | Admitting: Gynecology

## 2011-03-30 VITALS — BP 110/72 | Ht 61.0 in | Wt 143.0 lb

## 2011-03-30 DIAGNOSIS — Z3049 Encounter for surveillance of other contraceptives: Secondary | ICD-10-CM

## 2011-03-30 DIAGNOSIS — R82998 Other abnormal findings in urine: Secondary | ICD-10-CM

## 2011-03-30 DIAGNOSIS — Z01419 Encounter for gynecological examination (general) (routine) without abnormal findings: Secondary | ICD-10-CM

## 2011-03-30 DIAGNOSIS — N898 Other specified noninflammatory disorders of vagina: Secondary | ICD-10-CM

## 2011-03-30 DIAGNOSIS — B373 Candidiasis of vulva and vagina: Secondary | ICD-10-CM

## 2011-03-30 MED ORDER — NORETHINDRONE ACET-ETHINYL EST 1-20 MG-MCG PO TABS: 1.0000 | ORAL_TABLET | Freq: Every day | ORAL | Status: DC

## 2011-03-30 MED ORDER — FLUCONAZOLE 150 MG PO TABS: 150.0000 mg | ORAL_TABLET | Freq: Once | ORAL | Status: AC

## 2011-03-30 NOTE — Progress Notes (Signed)
Addended by: Landis Martins R on: 03/30/2011 10:30 AM   Modules accepted: Orders

## 2011-03-30 NOTE — Progress Notes (Signed)
Robin Potter Feb 16, 1971 [id number]        40 y.o.  for annual exam.  Doing well without complaints.  Past medical history,surgical history, medications, allergies, family history and social history were all reviewed and documented in the EPIC chart. ROS:  Was performed and pertinent positives and negatives are included in the history.  Exam: chaperone present Filed Vitals:   03/30/11 0857  BP: 110/72   General appearance  Normal Skin grossly normal Head/Neck normal with no cervical or supraclavicular adenopathy thyroid normal Lungs  clear Cardiac RR, without RMG Abdominal  soft, nontender, without masses, organomegaly or hernia Breasts  examined lying and sitting without masses, retractions, discharge or axillary adenopathy. Pelvic  Ext/BUS/vagina  normal with slight white discharge noted KOH wet prep done  Cervix  normal  Pap not done  Uterus  anteverted, normal size, shape and contour, midline and mobile nontender   Adnexa  Without masses or tenderness    Anus and perineum  normal   Rectovaginal  normal sphincter tone without palpated masses or tenderness.    Assessment/Plan:  40 y.o. female for annual exam.    1. White discharge. KOH was positive for yeast. She is on antibiotics for H. Pylori. Will cover with Diflucan 150x1 dose. I gave her 2 refills as she is on an extended antibiotic course indications recurrence. 2. 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 I reviewed screening guidelines currently and offered less frequent screening and she agrees with this and a Pap was not done today we'll plan on an every 3 year screening. Her last Pap was 2011 was normal. 3. Health maintenance.  Self breast exams on a monthly basis discussed and urged. Plans mammogram sometime in the next month or so. Had blood work done through her primary that was all normal and no blood work was done  today. They did not do a urinalysis I did one today. Assuming she continues well from a gynecologic standpoint then she'll see me in a year sooner as needed.    [name] MD, 9:57 AM 03/30/2011

## 2011-04-22 ENCOUNTER — Ambulatory Visit (HOSPITAL_COMMUNITY)
Admission: RE | Admit: 2011-04-22 | Discharge: 2011-04-22 | Disposition: A | Discharge status: Home / Self Care | Payer: PRIVATE HEALTH INSURANCE | Source: Ambulatory Visit | Attending: Gynecology | Admitting: Gynecology

## 2011-04-22 DIAGNOSIS — Z1231 Encounter for screening mammogram for malignant neoplasm of breast: Secondary | ICD-10-CM | POA: Insufficient documentation

## 2011-05-01 ENCOUNTER — Other Ambulatory Visit: Payer: Self-pay | Admitting: Gynecology

## 2011-05-01 DIAGNOSIS — Z3049 Encounter for surveillance of other contraceptives: Secondary | ICD-10-CM

## 2011-05-01 NOTE — Telephone Encounter (Signed)
Pt called stating pharmacy never got rx from last office visit rx called to pharmacy.

## 2011-05-03 ENCOUNTER — Other Ambulatory Visit: Payer: Self-pay | Admitting: *Deleted

## 2011-05-03 DIAGNOSIS — N6489 Other specified disorders of breast: Secondary | ICD-10-CM

## 2011-05-19 ENCOUNTER — Ambulatory Visit
Admission: RE | Admit: 2011-05-19 | Discharge: 2011-05-19 | Disposition: A | Discharge status: Home / Self Care | Payer: PRIVATE HEALTH INSURANCE | Source: Ambulatory Visit | Attending: Gynecology | Admitting: Gynecology

## 2011-05-19 DIAGNOSIS — N6489 Other specified disorders of breast: Secondary | ICD-10-CM

## 2012-02-06 ENCOUNTER — Telehealth: Payer: Self-pay | Admitting: *Deleted

## 2012-02-06 NOTE — Telephone Encounter (Signed)
Pt informed, order faxed to [phone](fax) [phone](office)

## 2012-02-06 NOTE — Telephone Encounter (Signed)
Pt called requesting order for 6 month f/u mammogram to be faxed to [facility]. Pt never returned to breast center for f/u visit. I called [facility] and was told that pt would need a diag. Mammogram & ultrasound. Please sign order and I will fax.

## 2012-02-06 NOTE — Telephone Encounter (Signed)
okay

## 2012-04-03 ENCOUNTER — Encounter: Payer: Self-pay | Admitting: Gynecology

## 2012-04-25 ENCOUNTER — Encounter: Payer: PRIVATE HEALTH INSURANCE | Admitting: Gynecology

## 2012-05-07 ENCOUNTER — Encounter: Payer: Self-pay | Admitting: Gynecology

## 2012-05-07 ENCOUNTER — Ambulatory Visit (INDEPENDENT_AMBULATORY_CARE_PROVIDER_SITE_OTHER): Payer: PRIVATE HEALTH INSURANCE | Admitting: Gynecology

## 2012-05-07 VITALS — BP 132/78 | Ht 61.25 in | Wt 151.0 lb

## 2012-05-07 DIAGNOSIS — Z13220 Encounter for screening for lipoid disorders: Secondary | ICD-10-CM

## 2012-05-07 DIAGNOSIS — Z01419 Encounter for gynecological examination (general) (routine) without abnormal findings: Secondary | ICD-10-CM

## 2012-05-07 DIAGNOSIS — Z3049 Encounter for surveillance of other contraceptives: Secondary | ICD-10-CM

## 2012-05-07 DIAGNOSIS — Z131 Encounter for screening for diabetes mellitus: Secondary | ICD-10-CM

## 2012-05-07 DIAGNOSIS — N951 Menopausal and female climacteric states: Secondary | ICD-10-CM

## 2012-05-07 LAB — COMPREHENSIVE METABOLIC PANEL
ALT: 15 U/L (ref 0–35)
AST: 19 U/L (ref 0–37)
Creat: 0.72 mg/dL (ref 0.50–1.10)
Total Bilirubin: 0.5 mg/dL (ref 0.3–1.2)

## 2012-05-07 LAB — CBC WITH DIFFERENTIAL/PLATELET
Basophils Absolute: 0.1 10*3/uL (ref 0.0–0.1)
Eosinophils Absolute: 0.2 10*3/uL (ref 0.0–0.7)
Eosinophils Relative: 3 % (ref 0–5)
Lymphocytes Relative: 25 % (ref 12–46)
MCH: 29.5 pg (ref 26.0–34.0)
MCV: 86.8 fL (ref 78.0–100.0)
Neutrophils Relative %: 61 % (ref 43–77)
Platelets: 327 10*3/uL (ref 150–400)
RDW: 12.3 % (ref 11.5–15.5)
WBC: 8.8 10*3/uL (ref 4.0–10.5)

## 2012-05-07 LAB — TSH: TSH: 1.337 u[IU]/mL (ref 0.350–4.500)

## 2012-05-07 LAB — LIPID PANEL
LDL Cholesterol: 127 mg/dL — ABNORMAL HIGH (ref 0–99)
Total CHOL/HDL Ratio: 3.7 Ratio
VLDL: 36 mg/dL (ref 0–40)

## 2012-05-07 MED ORDER — NORETHINDRONE ACET-ETHINYL EST 1-20 MG-MCG PO TABS: 1.0000 | ORAL_TABLET | Freq: Every day | ORAL | Status: DC

## 2012-05-07 NOTE — Patient Instructions (Signed)
Follow blood pressure Follow up in one year for check up Follow up with cholesterol doctor Consider seeing Dr Clarisse Gouge for headaches

## 2012-05-07 NOTE — Progress Notes (Signed)
Robin Potter 07/01/1970 [id number]        41 y.o.  [id number] for annual exam.    Past medical history,surgical history, medications, allergies, family history and social history were all reviewed and documented in the EPIC chart. ROS:  Was performed and pertinent positives and negatives are included in the history.  Exam: [name] assistant Filed Vitals:   05/07/12 0840  BP: 132/78  Height: 5' 1.25" (1.556 m)  Weight: 151 lb (68.493 kg)   General appearance  Normal Skin grossly normal Head/Neck normal with no cervical or supraclavicular adenopathy thyroid normal Lungs  clear Cardiac RR, without RMG Abdominal  soft, nontender, without masses, organomegaly or hernia Breasts  examined lying and sitting without masses, retractions, discharge or axillary adenopathy. Pelvic  Ext/BUS/vagina  normal   Cervix  normal   Uterus  anteverted, normal size, shape and contour, midline and mobile nontender   Adnexa  Without masses or tenderness    Anus and perineum  normal   Rectovaginal  normal sphincter tone without palpated masses or tenderness.    Assessment/Plan:  41 y.o. [id number] female for annual exam.   1. BCPs. Patient doing well on Loestrin 120 equivalence. 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 Does every other month withdrawal. Issues of increased risk of stroke associated with birth control pills and migraines discussed. Patient understands and accepts as well as the general increased risk of thrombosis. I gave her the name of Dr. Rubye Beach to consider further evaluation for her migraines. She has been seen in the past but it's been a while. 2. Mild elevated blood pressure. Patient just came from the gym. She is able to check at work and she will keep an eye on her blood pressures lungs is normal we'll follow. 3. Hot flashes particularly at night. Check TSH/comprehensive metabolic panel. Follow up with primary if continues. 4. History of hypercholesterolemia. Lipid profile done.  She can get these results to show her primary physician who follows her for her elevated cholesterol. 5. Mammogram.  Just had mammogram October 2013. Following area in the right breast with asymmetry with planned repeat in 6 months and she knows to do so. SBE monthly reviewed. 6. Pap smear. No Pap smear done today. Last Pap smear 2011.  History LGSIL 2000 with negative Pap smears since then. Repeat Pap smear next year a 3 year interval. 7. Health maintenance. Baseline CBC comprehensive metabolic panel lipid profile TSH and urinalysis ordered. Follow up one year, sooner as needed.    [name] MD, 9:24 AM 05/07/2012

## 2012-05-08 LAB — URINALYSIS W MICROSCOPIC + REFLEX CULTURE
Hgb urine dipstick: NEGATIVE
Leukocytes, UA: NEGATIVE
Nitrite: NEGATIVE
Protein, ur: NEGATIVE mg/dL

## 2012-07-27 ENCOUNTER — Other Ambulatory Visit: Payer: Self-pay

## 2013-03-21 ENCOUNTER — Telehealth: Payer: Self-pay | Admitting: *Deleted

## 2013-03-21 DIAGNOSIS — Z3049 Encounter for surveillance of other contraceptives: Secondary | ICD-10-CM

## 2013-03-21 MED ORDER — NORETHINDRONE ACET-ETHINYL EST 1-20 MG-MCG PO TABS: 1.0000 | ORAL_TABLET | Freq: Every day | ORAL | Status: DC

## 2013-03-21 NOTE — Telephone Encounter (Signed)
Pt called requesting refill on birth control pills, I left message on pt voicemail that annual due in nov. To please schedule.

## 2013-04-03 ENCOUNTER — Encounter: Payer: Self-pay | Admitting: Gynecology

## 2013-04-17 ENCOUNTER — Other Ambulatory Visit: Payer: Self-pay

## 2013-04-24 ENCOUNTER — Encounter: Payer: Self-pay | Admitting: Gynecology

## 2013-04-28 ENCOUNTER — Telehealth: Payer: Self-pay | Admitting: *Deleted

## 2013-04-28 DIAGNOSIS — Z3049 Encounter for surveillance of other contraceptives: Secondary | ICD-10-CM

## 2013-04-28 MED ORDER — NORETHINDRONE ACET-ETHINYL EST 1-20 MG-MCG PO TABS: 1.0000 | ORAL_TABLET | Freq: Every day | ORAL | Status: DC

## 2013-04-28 NOTE — Telephone Encounter (Signed)
Pt annual scheduled on Dec 4, pt will need refill on birth control pills. 1 pack sent into pharmacy for pt.

## 2013-05-15 ENCOUNTER — Other Ambulatory Visit (HOSPITAL_COMMUNITY)
Admission: RE | Admit: 2013-05-15 | Discharge: 2013-05-15 | Disposition: A | Discharge status: Home / Self Care | Payer: PRIVATE HEALTH INSURANCE | Source: Ambulatory Visit | Attending: Gynecology | Admitting: Gynecology

## 2013-05-15 ENCOUNTER — Ambulatory Visit (INDEPENDENT_AMBULATORY_CARE_PROVIDER_SITE_OTHER): Payer: PRIVATE HEALTH INSURANCE | Admitting: Gynecology

## 2013-05-15 ENCOUNTER — Encounter: Payer: Self-pay | Admitting: Gynecology

## 2013-05-15 VITALS — BP 112/62 | Ht 61.5 in | Wt 156.0 lb

## 2013-05-15 DIAGNOSIS — Z3049 Encounter for surveillance of other contraceptives: Secondary | ICD-10-CM

## 2013-05-15 DIAGNOSIS — Z01419 Encounter for gynecological examination (general) (routine) without abnormal findings: Secondary | ICD-10-CM

## 2013-05-15 DIAGNOSIS — Z1151 Encounter for screening for human papillomavirus (HPV): Secondary | ICD-10-CM | POA: Insufficient documentation

## 2013-05-15 LAB — CBC WITH DIFFERENTIAL/PLATELET
Eosinophils Relative: 2 % (ref 0–5)
Lymphocytes Relative: 27 % (ref 12–46)
Lymphs Abs: 2.2 10*3/uL (ref 0.7–4.0)
MCHC: 34.1 g/dL (ref 30.0–36.0)
MCV: 86.2 fL (ref 78.0–100.0)
Monocytes Relative: 10 % (ref 3–12)
Neutrophils Relative %: 60 % (ref 43–77)
Platelets: 321 10*3/uL (ref 150–400)
RBC: 4.79 MIL/uL (ref 3.87–5.11)
WBC: 7.9 10*3/uL (ref 4.0–10.5)

## 2013-05-15 LAB — URINALYSIS W MICROSCOPIC + REFLEX CULTURE
Bilirubin Urine: NEGATIVE
Crystals: NONE SEEN
Glucose, UA: NEGATIVE mg/dL
Specific Gravity, Urine: 1.022 (ref 1.005–1.030)
Squamous Epithelial / LPF: NONE SEEN

## 2013-05-15 LAB — LIPID PANEL
Cholesterol: 225 mg/dL — ABNORMAL HIGH (ref 0–200)
HDL: 58 mg/dL (ref >39)
LDL Cholesterol: 146 mg/dL — ABNORMAL HIGH (ref 0–99)

## 2013-05-15 LAB — COMPREHENSIVE METABOLIC PANEL
ALT: 8 U/L (ref 0–35)
AST: 13 U/L (ref 0–37)
Albumin: 3.9 g/dL (ref 3.5–5.2)
Alkaline Phosphatase: 52 U/L (ref 39–117)
CO2: 27 mEq/L (ref 19–32)
Calcium: 9.1 mg/dL (ref 8.4–10.5)
Chloride: 102 mEq/L (ref 96–112)
Sodium: 137 mEq/L (ref 135–145)
Total Protein: 6.8 g/dL (ref 6.0–8.3)

## 2013-05-15 MED ORDER — NORETHINDRONE ACET-ETHINYL EST 1-20 MG-MCG PO TABS: 1.0000 | ORAL_TABLET | Freq: Every day | ORAL | Status: DC

## 2013-05-15 NOTE — Addendum Note (Signed)
Addended by: Dayna Barker on: 05/15/2013 10:03 AM   Modules accepted: Orders

## 2013-05-15 NOTE — Progress Notes (Signed)
Robin Potter 05-18-1971 [id number]        42 y.o.  [id number] for annual exam.  Several issues noted below.  Past medical history,surgical history, problem list, medications, allergies, family history and social history were all reviewed and documented in the EPIC chart.  ROS:  Performed and pertinent positives and negatives are included in the history, assessment and plan .  Exam: Kim assistant Filed Vitals:   05/15/13 0922  BP: 112/62  Height: 5' 1.5" (1.562 m)  Weight: 156 lb (70.761 kg)   General appearance  Normal Skin grossly normal Head/Neck normal with no cervical or supraclavicular adenopathy thyroid normal Lungs  clear Cardiac RR, without RMG Abdominal  soft, nontender, without masses, organomegaly or hernia Breasts  examined lying and sitting without masses, retractions, discharge or axillary adenopathy. Pelvic  Ext/BUS/vagina  Normal   Cervix  Normal Pap/HPV  Uterus  anteverted, normal size, shape and contour, midline and mobile nontender   Adnexa  Without masses or tenderness    Anus and perineum  Normal   Rectovaginal  Normal sphincter tone without palpated masses or tenderness.    Assessment/Plan:  42 y.o. [id number] female for annual exam, no menses with continuous birth control pills.   1. BCPs. Patient taking continuous birth control pills without menses. Finds that this helps prevent her migraine headaches. They tended to be menstrual migraines. We again discussed risks of birth control pills and migraines to include increased risk of stroke as well as DVT and MI. Does not have aura. Again offered/recommended followup with neurologist as she has not seen one for a while. Alternative birth control to include Mirena IUD discussed. Patient wants to continue pills at this time except she risks. Refill x1 year provided. 2. 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 We'll continue to monitor. 3. Pap smear 2011. Pap/HPV done today. History of LGSIL 2000  with negative Pap smears since then. 4. Mammography 04/2013. Continue with annual mammography.  SBE monthly reviewed. 5. Health maintenance. Baseline CBC comprehensive metabolic panel lipid profile urinalysis ordered. She is going to obtain a copy of these from my chart to show her primary who is following her for her hypercholesterolemia. She is fasting today. Followup one year, sooner as needed.   Note: This document was prepared with digital dictation and possible smart phrase technology. Any transcriptional errors that result from this process are unintentional.   [name] MD, 9:58 AM 05/15/2013

## 2013-05-15 NOTE — Patient Instructions (Signed)
Follow up in one year for annual exam 

## 2013-09-15 ENCOUNTER — Encounter: Payer: Self-pay | Admitting: Gynecology

## 2013-09-16 NOTE — Telephone Encounter (Signed)
There is some evidence that Topamax decreases efficacy of birth control pills. 1 cancer would be to increase the dose of estrogen in the birth control pill which may help with this. The problem is by increasing the estrogen dose you may increase the risk of complications associated with migraine and birth control pills such as stroke. One alternative would be to consider an IUD whose efficacy does not appear to be dramatically affected by oral medication.

## 2014-01-19 ENCOUNTER — Other Ambulatory Visit: Payer: Self-pay | Admitting: Gynecology

## 2014-04-13 ENCOUNTER — Encounter: Payer: Self-pay | Admitting: Gynecology

## 2014-04-30 ENCOUNTER — Encounter: Payer: Self-pay | Admitting: Gynecology

## 2014-05-21 ENCOUNTER — Encounter: Payer: PRIVATE HEALTH INSURANCE | Admitting: Gynecology

## 2014-06-16 ENCOUNTER — Encounter: Payer: PRIVATE HEALTH INSURANCE | Admitting: Gynecology

## 2014-07-08 ENCOUNTER — Encounter: Payer: Self-pay | Admitting: Gynecology

## 2014-07-08 ENCOUNTER — Ambulatory Visit (INDEPENDENT_AMBULATORY_CARE_PROVIDER_SITE_OTHER): Payer: PRIVATE HEALTH INSURANCE | Admitting: Gynecology

## 2014-07-08 ENCOUNTER — Other Ambulatory Visit (HOSPITAL_COMMUNITY)
Admission: RE | Admit: 2014-07-08 | Discharge: 2014-07-08 | Disposition: A | Discharge status: Home / Self Care | Payer: PRIVATE HEALTH INSURANCE | Source: Ambulatory Visit | Attending: Gynecology | Admitting: Gynecology

## 2014-07-08 VITALS — BP 124/74 | Ht 61.0 in | Wt 154.0 lb

## 2014-07-08 DIAGNOSIS — N889 Noninflammatory disorder of cervix uteri, unspecified: Secondary | ICD-10-CM

## 2014-07-08 DIAGNOSIS — Z01419 Encounter for gynecological examination (general) (routine) without abnormal findings: Secondary | ICD-10-CM | POA: Diagnosis present

## 2014-07-08 LAB — COMPREHENSIVE METABOLIC PANEL
ALT: 10 U/L (ref 0–35)
AST: 13 U/L (ref 0–37)
Albumin: 4.1 g/dL (ref 3.5–5.2)
Alkaline Phosphatase: 59 U/L (ref 39–117)
BUN: 12 mg/dL (ref 6–23)
CO2: 25 meq/L (ref 19–32)
Calcium: 9 mg/dL (ref 8.4–10.5)
Chloride: 103 mEq/L (ref 96–112)
Creat: 0.79 mg/dL (ref 0.50–1.10)
Glucose, Bld: 99 mg/dL (ref 70–99)
Potassium: 4.3 mEq/L (ref 3.5–5.3)
Sodium: 137 mEq/L (ref 135–145)
Total Bilirubin: 0.6 mg/dL (ref 0.2–1.2)
Total Protein: 6.7 g/dL (ref 6.0–8.3)

## 2014-07-08 LAB — CBC WITH DIFFERENTIAL/PLATELET
Basophils Absolute: 0.1 10*3/uL (ref 0.0–0.1)
Basophils Relative: 1 % (ref 0–1)
EOS ABS: 0.3 10*3/uL (ref 0.0–0.7)
Eosinophils Relative: 4 % (ref 0–5)
HCT: 40.3 % (ref 36.0–46.0)
Hemoglobin: 13.7 g/dL (ref 12.0–15.0)
Lymphocytes Relative: 31 % (ref 12–46)
Lymphs Abs: 2 10*3/uL (ref 0.7–4.0)
MCH: 29.3 pg (ref 26.0–34.0)
MCHC: 34 g/dL (ref 30.0–36.0)
MCV: 86.1 fL (ref 78.0–100.0)
MPV: 8.6 fL (ref 8.6–12.4)
Monocytes Absolute: 0.6 10*3/uL (ref 0.1–1.0)
Monocytes Relative: 9 % (ref 3–12)
Neutro Abs: 3.5 10*3/uL (ref 1.7–7.7)
Neutrophils Relative %: 55 % (ref 43–77)
PLATELETS: 337 10*3/uL (ref 150–400)
RBC: 4.68 MIL/uL (ref 3.87–5.11)
RDW: 12.9 % (ref 11.5–15.5)
WBC: 6.3 10*3/uL (ref 4.0–10.5)

## 2014-07-08 LAB — URINALYSIS W MICROSCOPIC + REFLEX CULTURE
Bacteria, UA: NONE SEEN
Bilirubin Urine: NEGATIVE
CASTS: NONE SEEN
Crystals: NONE SEEN
GLUCOSE, UA: NEGATIVE mg/dL
Hgb urine dipstick: NEGATIVE
Ketones, ur: NEGATIVE mg/dL
LEUKOCYTES UA: NEGATIVE
NITRITE: NEGATIVE
Protein, ur: NEGATIVE mg/dL
SPECIFIC GRAVITY, URINE: 1.029 (ref 1.005–1.030)
UROBILINOGEN UA: 1 mg/dL (ref 0.0–1.0)
pH: 6 (ref 5.0–8.0)

## 2014-07-08 LAB — LIPID PANEL
Cholesterol: 198 mg/dL (ref 0–200)
HDL: 66 mg/dL (ref >39)
LDL Cholesterol: 111 mg/dL — ABNORMAL HIGH (ref 0–99)
TRIGLYCERIDES: 104 mg/dL (ref <150)
Total CHOL/HDL Ratio: 3 Ratio
VLDL: 21 mg/dL (ref 0–40)

## 2014-07-08 MED ORDER — NORETHINDRONE ACET-ETHINYL EST 1-20 MG-MCG PO TABS: 1.0000 | ORAL_TABLET | Freq: Every day | ORAL | Status: DC

## 2014-07-08 NOTE — Addendum Note (Signed)
Addended by: Dayna BarkerGARDNER, KIMBERLY K on: 07/08/2014 09:11 AM   Modules accepted: Orders

## 2014-07-08 NOTE — Progress Notes (Signed)
Robin Potter January 13, 1971 [id number]        44 y.o.  [id number] for annual exam.  Several issues noted below.  Past medical history,surgical history, problem list, medications, allergies, family history and social history were all reviewed and documented as reviewed in the EPIC chart.  ROS:  Performed with pertinent positives and negatives included in the history, assessment and plan.   Additional significant findings :  none   Exam: [facility] personassistant Filed Vitals:   07/08/14 0821  BP: 124/74  Height: 5\' 1"  (1.549 m)  Weight: 154 lb (69.854 kg)   General appearance:  Normal affect, orientation and appearance. Skin: Grossly normal HEENT: Without gross lesions.  No cervical or supraclavicular adenopathy. Thyroid normal.  Lungs:  Clear without wheezing, rales or rhonchi Cardiac: RR, without RMG Abdominal:  Soft, nontender, without masses, guarding, rebound, organomegaly or hernia Breasts:  Examined lying and sitting without masses, retractions, discharge or axillary adenopathy. Pelvic:  Ext/BUS/vagina normal  Cervix with bulbous growth posterior lip, hyper vascular. Pap smear over this area done.  Uterus anteverted, normal size, shape and contour, midline and mobile nontender   Adnexa  Without masses or tenderness    Anus and perineum  Normal   Rectovaginal  Normal sphincter tone without palpated masses or tenderness.    Assessment/Plan:  44 y.o. [id number] female for annual exam without menses, continuous oral contraceptives.   1. Bulbous area posterior lip of the cervix. Questioned hypertrophic transformation zone versus polyp/neoplastic. Pap smear done. Recommend colposcopy appointment and will schedule as a LEEP to have available excision capabilities at that time. I reviewed which involved with the procedure to include the use of LEEP. Patient will schedule in follow up for this. 2. Continuous oral contraceptives. She is doing well without breakthrough bleeding. Wants to continue. We've  discussed risks to include stroke heart attack DVT and possible increased risk with age. Patient's comfortable continuing and refilled her 1/20 equivalent 1 year 3. Pap smear/HPV negative 05/2013. Pap smear repeated today in reference to above. 4. Mammography 04/2014. Continue with annual mammography. 5. History of HSV with initial outbreak and no recurrences afterwards. Continue to monitor and report any outbreaks. 6. Health maintenance. Patient is fasting. Followed for hypercholesterolemia on Lipitor daily. Baseline CBC comprehensive metabolic panel lipid profile urinalysis ordered. Follow up with the LEEP appointment as scheduled.     Robin Potter,Robin Potter Robin Potter, 8:50 AM 07/08/2014

## 2014-07-08 NOTE — Patient Instructions (Signed)
Follow up for cervical biopsy appointment as scheduled.  You may obtain a copy of any labs that were done today by logging onto MyChart as outlined in the instructions provided with your AVS (after visit summary). The office will not call with normal lab results but certainly if there are any significant abnormalities then we will contact you.   Health Maintenance, Female A healthy lifestyle and preventative care can promote health and wellness.  Maintain regular health, dental, and eye exams.  Eat a healthy diet. Foods like vegetables, fruits, whole grains, low-fat dairy products, and lean protein foods contain the nutrients you need without too many calories. Decrease your intake of foods high in solid fats, added sugars, and salt. Get information about a proper diet from your caregiver, if necessary.  Regular physical exercise is one of the most important things you can do for your health. Most adults should get at least 150 minutes of moderate-intensity exercise (any activity that increases your heart rate and causes you to sweat) each week. In addition, most adults need muscle-strengthening exercises on 2 or more days a week.   Maintain a healthy weight. The body mass index (BMI) is a screening tool to identify possible weight problems. It provides an estimate of body fat based on height and weight. Your caregiver can help determine your BMI, and can help you achieve or maintain a healthy weight. For adults 20 years and older:  A BMI below 18.5 is considered underweight.  A BMI of 18.5 to 24.9 is normal.  A BMI of 25 to 29.9 is considered overweight.  A BMI of 30 and above is considered obese.  Maintain normal blood lipids and cholesterol by exercising and minimizing your intake of saturated fat. Eat a balanced diet with plenty of fruits and vegetables. Blood tests for lipids and cholesterol should begin at age 41 and be repeated every 5 years. If your lipid or cholesterol levels are  high, you are over 50, or you are a high risk for heart disease, you may need your cholesterol levels checked more frequently.Ongoing high lipid and cholesterol levels should be treated with medicines if diet and exercise are not effective.  If you smoke, find out from your caregiver how to quit. If you do not use tobacco, do not start.  Lung cancer screening is recommended for adults aged 73 80 years who are at high risk for developing lung cancer because of a history of smoking. Yearly low-dose computed tomography (CT) is recommended for people who have at least a 30-pack-year history of smoking and are a current smoker or have quit within the past 15 years. A pack year of smoking is smoking an average of 1 pack of cigarettes a day for 1 year (for example: 1 pack a day for 30 years or 2 packs a day for 15 years). Yearly screening should continue until the smoker has stopped smoking for at least 15 years. Yearly screening should also be stopped for people who develop a health problem that would prevent them from having lung cancer treatment.  If you are pregnant, do not drink alcohol. If you are breastfeeding, be very cautious about drinking alcohol. If you are not pregnant and choose to drink alcohol, do not exceed 1 drink per day. One drink is considered to be 12 ounces (355 mL) of beer, 5 ounces (148 mL) of wine, or 1.5 ounces (44 mL) of liquor.  Avoid use of street drugs. Do not share needles with anyone. Ask for  help if you need support or instructions about stopping the use of drugs.  High blood pressure causes heart disease and increases the risk of stroke. Blood pressure should be checked at least every 1 to 2 years. Ongoing high blood pressure should be treated with medicines, if weight loss and exercise are not effective.  If you are 70 to 46 years old, ask your caregiver if you should take aspirin to prevent strokes.  Diabetes screening involves taking a blood sample to check your fasting  blood sugar level. This should be done once every 3 years, after age 58, if you are within normal weight and without risk factors for diabetes. Testing should be considered at a younger age or be carried out more frequently if you are overweight and have at least 1 risk factor for diabetes.  Breast cancer screening is essential preventative care for women. You should practice "breast self-awareness." This means understanding the normal appearance and feel of your breasts and may include breast self-examination. Any changes detected, no matter how small, should be reported to a caregiver. Women in their 62s and 30s should have a clinical breast exam (CBE) by a caregiver as part of a regular health exam every 1 to 3 years. After age 87, women should have a CBE every year. Starting at age 50, women should consider having a mammogram (breast X-ray) every year. Women who have a family history of breast cancer should talk to their caregiver about genetic screening. Women at a high risk of breast cancer should talk to their caregiver about having an MRI and a mammogram every year.  Breast cancer gene (BRCA)-related cancer risk assessment is recommended for women who have family members with BRCA-related cancers. BRCA-related cancers include breast, ovarian, tubal, and peritoneal cancers. Having family members with these cancers may be associated with an increased risk for harmful changes (mutations) in the breast cancer genes BRCA1 and BRCA2. Results of the assessment will determine the need for genetic counseling and BRCA1 and BRCA2 testing.  The Pap test is a screening test for cervical cancer. Women should have a Pap test starting at age 48. Between ages 48 and 68, Pap tests should be repeated every 2 years. Beginning at age 52, you should have a Pap test every 3 years as long as the past 3 Pap tests have been normal. If you had a hysterectomy for a problem that was not cancer or a condition that could lead to  cancer, then you no longer need Pap tests. If you are between ages 8 and 82, and you have had normal Pap tests going back 10 years, you no longer need Pap tests. If you have had past treatment for cervical cancer or a condition that could lead to cancer, you need Pap tests and screening for cancer for at least 20 years after your treatment. If Pap tests have been discontinued, risk factors (such as a new sexual partner) need to be reassessed to determine if screening should be resumed. Some women have medical problems that increase the chance of getting cervical cancer. In these cases, your caregiver may recommend more frequent screening and Pap tests.  The human papillomavirus (HPV) test is an additional test that may be used for cervical cancer screening. The HPV test looks for the virus that can cause the cell changes on the cervix. The cells collected during the Pap test can be tested for HPV. The HPV test could be used to screen women aged 26 years and older,  and should be used in women of any age who have unclear Pap test results. After the age of 72, women should have HPV testing at the same frequency as a Pap test.  Colorectal cancer can be detected and often prevented. Most routine colorectal cancer screening begins at the age of 72 and continues through age 69. However, your caregiver may recommend screening at an earlier age if you have risk factors for colon cancer. On a yearly basis, your caregiver may provide home test kits to check for hidden blood in the stool. Use of a small camera at the end of a tube, to directly examine the colon (sigmoidoscopy or colonoscopy), can detect the earliest forms of colorectal cancer. Talk to your caregiver about this at age 53, when routine screening begins. Direct examination of the colon should be repeated every 5 to 10 years through age 4, unless early forms of pre-cancerous polyps or small growths are found.  Hepatitis C blood testing is recommended for  all people born from [birth year] through 1967 and any individual with known risks for hepatitis C.  Practice safe sex. Use condoms and avoid high-risk sexual practices to reduce the spread of sexually transmitted infections (STIs). Sexually active women aged 37 and younger should be checked for Chlamydia, which is a common sexually transmitted infection. Older women with new or multiple partners should also be tested for Chlamydia. Testing for other STIs is recommended if you are sexually active and at increased risk.  Osteoporosis is a disease in which the bones lose minerals and strength with aging. This can result in serious bone fractures. The risk of osteoporosis can be identified using a bone density scan. Women ages 90 and over and women at risk for fractures or osteoporosis should discuss screening with their caregivers. Ask your caregiver whether you should be taking a calcium supplement or vitamin D to reduce the rate of osteoporosis.  Menopause can be associated with physical symptoms and risks. Hormone replacement therapy is available to decrease symptoms and risks. You should talk to your caregiver about whether hormone replacement therapy is right for you.  Use sunscreen. Apply sunscreen liberally and repeatedly throughout the day. You should seek shade when your shadow is shorter than you. Protect yourself by wearing long sleeves, pants, a wide-brimmed hat, and sunglasses year round, whenever you are outdoors.  Notify your caregiver of new moles or changes in moles, especially if there is a change in shape or color. Also notify your caregiver if a mole is larger than the size of a pencil eraser.  Stay current with your immunizations. Document Released: 12/12/2010 Document Revised: 09/23/2012 Document Reviewed: 12/12/2010 [facility] Patient Information 2014 [place].

## 2014-07-09 LAB — CYTOLOGY - PAP

## 2014-07-21 ENCOUNTER — Encounter: Payer: Self-pay | Admitting: Gynecology

## 2014-07-21 ENCOUNTER — Ambulatory Visit (INDEPENDENT_AMBULATORY_CARE_PROVIDER_SITE_OTHER): Payer: PRIVATE HEALTH INSURANCE | Admitting: Gynecology

## 2014-07-21 DIAGNOSIS — N889 Noninflammatory disorder of cervix uteri, unspecified: Secondary | ICD-10-CM

## 2014-07-21 NOTE — Patient Instructions (Signed)
Loop Electrosurgical Excision Procedure Care After Refer to this sheet in the next few weeks. These instructions provide you with information on caring for yourself after your procedure. Your caregiver may also give you more specific instructions. Your treatment has been planned according to current medical practices, but problems sometimes occur. Call your caregiver if you have any problems or questions after your procedure. HOME CARE INSTRUCTIONS   Do not use tampons, douche, or have sexual intercourse for 2 weeks or as directed by your caregiver.  Begin normal activities if you have no or minimal cramping or bleeding, unless directed otherwise by your caregiver.  Take your temperature if you feel sick. Write down your temperature on paper, and tell your caregiver if you have a fever.  Take all medicines as directed by your caregiver.  Keep all your follow-up appointments and Pap tests as directed by your caregiver. SEEK IMMEDIATE MEDICAL CARE IF:   You have bleeding that is heavier or longer than a normal menstrual cycle.  You have bleeding that is bright red.  You have blood clots.  You have a fever.  You have increasing cramps or pain not relieved by medicine.  You develop abdominal pain that does not seem to be related to the same area of earlier cramping and pain.  You are lightheaded, unusually weak, or faint.  You develop painful or bloody urination.  You develop a bad smelling vaginal discharge. MAKE SURE YOU:  Understand these instructions.  Will watch your condition.  Will get help right away if you are not doing well or get worse. Document Released: 02/09/2011 Document Revised: 08/21/2011 Document Reviewed: 02/09/2011 [employer] Patient Information 2015 [employer]. This information is not intended to replace advice given to you by your health care provider. Make sure you discuss any questions you have with your health care provider.  

## 2014-07-21 NOTE — Progress Notes (Signed)
Briahnna B Ackerley 1970-10-03 [id number]        44 y.o.  [id number] Presents for colposcopy and possible excisional biopsy due to prominent area posterior lip of the cervix never appreciated previously.  Pap smear 07/08/2014 negative.  Past medical history,surgical history, problem list, medications, allergies, family history and social history were all reviewed and documented in the EPIC chart.  Directed ROS with pertinent positives and negatives documented in the history of present illness/assessment and plan.  Exam: Kim assistant General appearance:  Normal External BUS vagina normal.  Cervix with bulbous hypervascular appearing area posterior lip.  Colposcopy performed after acetic acid cleanse was normal adequate with normal-appearing transformational epithelium overlying this bulbous area. Physical Exam  Genitourinary:        Reviewed with patient that I think this area represents just normal transformational zone more prominent posteriorly. Given its dramatic appearance I would feel more comfortable excising the area both for a specimen but also so we are not having to address this every time we look at the cervix to wonder if things are changing. Patient agrees with the plan.  I reviewed the procedure with her to include the risks of infection, bleeding requiring retreatment and damage to surrounding tissues including vagina, bladder and rectum.  Patient understands and accepts the above.   Procedure:   The cervix was visualized with a LEEP speculum cleansed with acetic acid with colposcopy findings as above. The posterior lip of the cervix was injected with 2% lidocaine solution with epinephrine 1 / 100,000 dilution, a total of 4 cc's were used. The LEEP generator was set at 1060 W cutting 60 W coagulation blend one current. Using the 12 x 20 mm LEEP wand, the protruding area of the posterior lip of the cervix was excised to the level of the normal surrounding mucosa in a single pass.   Ball coagulation was delivered to the LEEP base to achieve hemostasis and prophylactic Monsel's solution was applied. The specimen was sent to pathology. The patient tolerated the procedure well and postoperative instructions were discussed with her. Patient knows to follow up for pathology results in several days and then we will discuss the long-term plan as far as follow up screening.   Dara LordsFONTAINE,TIMOTHY P MD, 1:45 PM 07/21/2014

## 2015-04-09 ENCOUNTER — Other Ambulatory Visit: Payer: Self-pay | Admitting: Gynecology

## 2015-05-18 ENCOUNTER — Encounter: Payer: Self-pay | Admitting: Gynecology

## 2015-08-04 ENCOUNTER — Encounter: Payer: PRIVATE HEALTH INSURANCE | Admitting: Gynecology

## 2015-08-27 ENCOUNTER — Encounter: Payer: Self-pay | Admitting: Gynecology

## 2015-08-27 ENCOUNTER — Ambulatory Visit (INDEPENDENT_AMBULATORY_CARE_PROVIDER_SITE_OTHER): Payer: PRIVATE HEALTH INSURANCE | Admitting: Gynecology

## 2015-08-27 VITALS — BP 118/74 | Ht 61.0 in | Wt 153.0 lb

## 2015-08-27 DIAGNOSIS — Z01419 Encounter for gynecological examination (general) (routine) without abnormal findings: Secondary | ICD-10-CM | POA: Diagnosis not present

## 2015-08-27 LAB — CBC WITH DIFFERENTIAL/PLATELET
BASOS PCT: 1 % (ref 0–1)
Basophils Absolute: 0.1 10*3/uL (ref 0.0–0.1)
EOS ABS: 0.2 10*3/uL (ref 0.0–0.7)
EOS PCT: 3 % (ref 0–5)
HCT: 42.7 % (ref 36.0–46.0)
Hemoglobin: 14.5 g/dL (ref 12.0–15.0)
LYMPHS ABS: 2.3 10*3/uL (ref 0.7–4.0)
Lymphocytes Relative: 28 % (ref 12–46)
MCH: 30.3 pg (ref 26.0–34.0)
MCHC: 34 g/dL (ref 30.0–36.0)
MCV: 89.3 fL (ref 78.0–100.0)
MONOS PCT: 10 % (ref 3–12)
MPV: 8.8 fL (ref 8.6–12.4)
Monocytes Absolute: 0.8 10*3/uL (ref 0.1–1.0)
NEUTROS PCT: 58 % (ref 43–77)
Neutro Abs: 4.7 10*3/uL (ref 1.7–7.7)
PLATELETS: 336 10*3/uL (ref 150–400)
RBC: 4.78 MIL/uL (ref 3.87–5.11)
RDW: 12.6 % (ref 11.5–15.5)
WBC: 8.1 10*3/uL (ref 4.0–10.5)

## 2015-08-27 MED ORDER — NORETHINDRONE ACET-ETHINYL EST 1-20 MG-MCG PO TABS: 1.0000 | ORAL_TABLET | Freq: Every day | ORAL | Status: DC

## 2015-08-27 NOTE — Patient Instructions (Signed)

## 2015-08-27 NOTE — Addendum Note (Signed)
Addended by: Dara LordsFONTAINE, Kinte Trim P on: 08/27/2015 08:24 AM   Modules accepted: Orders

## 2015-08-27 NOTE — Progress Notes (Addendum)
    Robin Potter June 28, 1970 [id number]        45 y.o.  [id number]  for annual exam.  Doing well without complaints  Past medical history,surgical history, problem list, medications, allergies, family history and social history were all reviewed and documented as reviewed in the EPIC chart.  ROS:  Performed with pertinent positives and negatives included in the history, assessment and plan.   Additional significant findings :  none   Exam: Kennon PortelaKim Gardner assistant Filed Vitals:   08/27/15 0759  BP: 118/74  Height: 5\' 1"  (1.549 m)  Weight: 153 lb (69.4 kg)   General appearance:  Normal affect, orientation and appearance. Skin: Grossly normal HEENT: Without gross lesions.  No cervical or supraclavicular adenopathy. Thyroid normal.  Lungs:  Clear without wheezing, rales or rhonchi Cardiac: RR, without RMG Abdominal:  Soft, nontender, without masses, guarding, rebound, organomegaly or hernia Breasts:  Examined lying and sitting without masses, retractions, discharge or axillary adenopathy. Pelvic:  Ext/BUS/vagina normal  Cervix normal  Uterus anteverted, normal size, shape and contour, midline and mobile nontender   Adnexa without masses or tenderness    Anus and perineum normal   Rectovaginal normal sphincter tone without palpated masses or tenderness.    Assessment/Plan:  45 y.o. [id number] female for annual exam without menses on continuous pills.   1. Continuous pills. Patient continues on Loestrin 1/20 equivalent continuous BCPs doing well. No significant irregular bleeding. I reviewed the risks with advancing age to include possible increased risk of stroke heart attack DVT. Not being followed for any significant medical issues other than mild elevated cholesterol. Wants to continue on the pills accepting the risk and I refilled her 1 year. 2. Mammography 05/2015. Continue with annual mammography when due. SBE monthly reviewed. 3. Pap smear 2016.  Pap/HPV 2014. No Pap smear done  today.  History of LGSIL 2000. 4. 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 Report any recurrences. 5. Health maintenance. Patient reports routine blood work done elsewhere. Follow up in one year, sooner as needed.  Addendum:  Patient checked on her MyChart and she had routine labs minus a CBC done elsewhere.  Will do CBC today   Dara LordsFONTAINE,Kase Shughart P MD, 8:17 AM 08/27/2015

## 2015-08-28 LAB — URINALYSIS W MICROSCOPIC + REFLEX CULTURE
BACTERIA UA: NONE SEEN [HPF]
BILIRUBIN URINE: NEGATIVE
CASTS: NONE SEEN [LPF]
Crystals: NONE SEEN [HPF]
Glucose, UA: NEGATIVE
HGB URINE DIPSTICK: NEGATIVE
KETONES UR: NEGATIVE
Leukocytes, UA: NEGATIVE
Nitrite: NEGATIVE
PROTEIN: NEGATIVE
RBC / HPF: NONE SEEN RBC/HPF (ref <=2)
SPECIFIC GRAVITY, URINE: 1.003 (ref 1.001–1.035)
SQUAMOUS EPITHELIAL / LPF: NONE SEEN [HPF] (ref <=5)
WBC UA: NONE SEEN WBC/HPF (ref <=5)
Yeast: NONE SEEN [HPF]
pH: 6.5 (ref 5.0–8.0)

## 2015-12-22 ENCOUNTER — Telehealth: Payer: Self-pay

## 2015-12-22 ENCOUNTER — Other Ambulatory Visit: Payer: Self-pay | Admitting: Gynecology

## 2015-12-22 ENCOUNTER — Telehealth: Payer: Self-pay | Admitting: *Deleted

## 2015-12-22 MED ORDER — NORETHIN ACE-ETH ESTRAD-FE 1-20 MG-MCG PO TABS: ORAL_TABLET | ORAL | Status: DC

## 2015-12-22 NOTE — Telephone Encounter (Signed)
Rx sent pt informed

## 2015-12-22 NOTE — Telephone Encounter (Signed)
Pt called having issues with getting refills on birth control pills, pt said she takes Loestrin Fe continuous, skipping all placebo pills in each pack. Directions on Rx state take daily, are you okay with patient taking Rx this way? If so a new Rx will need to be sent to pharmacy. Please advise

## 2015-12-22 NOTE — Telephone Encounter (Signed)
Okay for continuous birth control pills. Prescription can read dispense 3 packs, 1 pill daily for continuous use, refill through next annual exam

## 2015-12-22 NOTE — Telephone Encounter (Signed)
Victorino DikeJennifer had already handled this earlier today. Pharmacy notified.

## 2015-12-22 NOTE — Telephone Encounter (Signed)
The pharmacy had sent message this morning asking for new Rx to reflect enough pills for 90 day supply  (#84). I sent new Rx with directions Take one active pill qd x 3 mos then 7 days off pill and repeat.  The pharmacy called back and questioned directions because old Rx had said "take one daily.".  Your office note reads: 1. Continuous pills. Patient continues on Loestrin 1/20 equivalent continuous BCPs doing well. No significant irregular bleeding. I reviewed the risks with advancing age to include possible increased risk of stroke heart attack DVT. Not being followed for any significant medical issues other than mild elevated cholesterol. Wants to continue on the pills accepting the risk and I refilled her 1 year.  Does patient take OC's continuously with no break?

## 2016-05-18 ENCOUNTER — Encounter: Payer: Self-pay | Admitting: Gynecology

## 2016-08-30 ENCOUNTER — Encounter: Payer: PRIVATE HEALTH INSURANCE | Admitting: Gynecology

## 2016-09-01 ENCOUNTER — Ambulatory Visit (INDEPENDENT_AMBULATORY_CARE_PROVIDER_SITE_OTHER): Payer: PRIVATE HEALTH INSURANCE | Admitting: Gynecology

## 2016-09-01 ENCOUNTER — Encounter: Payer: Self-pay | Admitting: Gynecology

## 2016-09-01 VITALS — BP 118/76 | Ht 62.0 in | Wt 159.0 lb

## 2016-09-01 DIAGNOSIS — Z01419 Encounter for gynecological examination (general) (routine) without abnormal findings: Secondary | ICD-10-CM | POA: Diagnosis not present

## 2016-09-01 MED ORDER — NORETHINDRONE ACET-ETHINYL EST 1-20 MG-MCG PO TABS: 1.0000 | ORAL_TABLET | Freq: Every day | ORAL | 4 refills | Status: DC

## 2016-09-01 NOTE — Addendum Note (Signed)
Addended by: Dayna BarkerGARDNER, KIMBERLY K on: 09/01/2016 09:34 AM   Modules accepted: Orders

## 2016-09-01 NOTE — Progress Notes (Signed)
    Robin Potter [birth year] [id number]        46 y.o.  [id number] for annual exam.    Past medical history,surgical history, problem list, medications, allergies, family history and social history were all reviewed and documented as reviewed in the EPIC chart.  ROS:  Performed with pertinent positives and negatives included in the history, assessment and plan.   Additional significant findings :  None   Exam: Kennon PortelaKim Gardner assistant Vitals:   09/01/16 0826  BP: 118/76  Weight: 159 lb (72.1 kg)  Height: 5\' 2"  (1.575 m)   Body mass index is 29.08 kg/m.  General appearance:  Normal affect, orientation and appearance. Skin: Grossly normal HEENT: Without gross lesions.  No cervical or supraclavicular adenopathy. Thyroid normal.  Lungs:  Clear without wheezing, rales or rhonchi Cardiac: RR, without RMG Abdominal:  Soft, nontender, without masses, guarding, rebound, organomegaly or hernia Breasts:  Examined lying and sitting without masses, retractions, discharge or axillary adenopathy. Pelvic:  Ext, BUS, Vagina: Normal  Cervix: Normal. Pap smear done  Uterus: Anteverted, normal size, shape and contour, midline and mobile nontender   Adnexa: Without masses or tenderness    Anus and perineum: Normal   Rectovaginal: Normal sphincter tone without palpated masses or tenderness.    Assessment/Plan:  46 y.o. [id number] female for annual exam without menses, continuous oral contraceptives.   1. Continuous oral contraceptives. Using Loestrin 1/20 equivalent continuously. Having no bleeding. Offbrand labeling reviewed. Risks particular with advancing age to include thrombosis such as stroke heart attack DVT. Patient comfortable continuing I refilled her 1 year. 2. Mammography 05/2016. Continue with annual mammography when due. SBE monthly reviewed. 3. Pap smear 06/2014. Pap smear done today.  History of LGSIL 2000. 4. History of HSV with one outbreak and no recurrence. We'll report any  recurrences. 5. Health maintenance. No routine lab work done as patient reports this done elsewhere. Follow up 1 year, sooner as needed.   Dara LordsFONTAINE,Reeva Davern P MD, 9:06 AM 09/01/2016

## 2016-09-01 NOTE — Patient Instructions (Signed)

## 2016-09-04 LAB — PAP IG W/ RFLX HPV ASCU

## 2017-05-31 ENCOUNTER — Encounter: Payer: Self-pay | Admitting: Gynecology

## 2017-09-03 ENCOUNTER — Encounter: Payer: PRIVATE HEALTH INSURANCE | Admitting: Gynecology

## 2017-09-06 ENCOUNTER — Ambulatory Visit (INDEPENDENT_AMBULATORY_CARE_PROVIDER_SITE_OTHER): Payer: PRIVATE HEALTH INSURANCE | Admitting: Gynecology

## 2017-09-06 ENCOUNTER — Encounter: Payer: Self-pay | Admitting: Gynecology

## 2017-09-06 VITALS — BP 122/78 | Ht 61.0 in | Wt 164.0 lb

## 2017-09-06 DIAGNOSIS — Z01419 Encounter for gynecological examination (general) (routine) without abnormal findings: Secondary | ICD-10-CM | POA: Diagnosis not present

## 2017-09-06 MED ORDER — NORETHINDRONE ACET-ETHINYL EST 1-20 MG-MCG PO TABS: 1.0000 | ORAL_TABLET | Freq: Every day | ORAL | 4 refills | Status: DC

## 2017-09-06 NOTE — Progress Notes (Signed)
    Sabriyah B Peters [birth year] [id number]        47 y.o.  [id number] for annual gynecologic exam.  Doing well without complaints.  Past medical history,surgical history, problem list, medications, allergies, family history and social history were all reviewed and documented as reviewed in the EPIC chart.  ROS:  Performed with pertinent positives and negatives included in the history, assessment and plan.   Additional significant findings : None   Exam: Kennon PortelaKim Gardner assistant Vitals:   09/06/17 0802  BP: 122/78  Weight: 164 lb (74.4 kg)  Height: 5\' 1"  (1.549 m)   Body mass index is 30.99 kg/m.  General appearance:  Normal affect, orientation and appearance. Skin: Grossly normal HEENT: Without gross lesions.  No cervical or supraclavicular adenopathy. Thyroid normal.  Lungs:  Clear without wheezing, rales or rhonchi Cardiac: RR, without RMG Abdominal:  Soft, nontender, without masses, guarding, rebound, organomegaly or hernia Breasts:  Examined lying and sitting without masses, retractions, discharge or axillary adenopathy. Pelvic:  Ext, BUS, Vagina: Normal  Cervix: Normal  Uterus: Anteverted, normal size, shape and contour, midline and mobile nontender   Adnexa: Without masses or tenderness    Anus and perineum: Normal   Rectovaginal: Normal sphincter tone without palpated masses or tenderness.    Assessment/Plan:  47 y.o. [id number] female for annual gynecologic exam without menses, continuous oral contraceptives.   1. Continuous oral contraceptives.  Doing well without menses.  We again reviewed the risks to include thrombosis such as stroke heart attack DVT.  Possible increased risks with increasing age.  At this point patient comfortable continuing.  Refill times 1 year provided. 2. Mammography 05/2017.  Continue with annual mammography when due.  SBE monthly reviewed. 3. Pap smear 2018.  No Pap smear done today.  History of LGSIL 2000 with normal Pap smears since.  Plan repeat  Pap smear at 3-year interval per current screening guidelines 4. History of HSV with one outbreak and no recurrence.  5. Health maintenance.  No routine lab work done as patient does this elsewhere.  Follow-up 1 year, sooner as needed.   Dara Lordsimothy P Reshawn Ostlund MD, 8:23 AM 09/06/2017

## 2017-09-06 NOTE — Patient Instructions (Addendum)
Follow-up in 1 year for annual exam, sooner if any issues. 

## 2018-01-18 ENCOUNTER — Encounter: Payer: Self-pay | Admitting: Gynecology

## 2018-07-30 ENCOUNTER — Other Ambulatory Visit: Payer: Self-pay | Admitting: Gynecology

## 2018-09-10 ENCOUNTER — Encounter: Payer: PRIVATE HEALTH INSURANCE | Admitting: Gynecology

## 2018-09-18 ENCOUNTER — Encounter: Payer: PRIVATE HEALTH INSURANCE | Admitting: Gynecology

## 2018-10-04 ENCOUNTER — Other Ambulatory Visit: Payer: Self-pay

## 2018-10-07 ENCOUNTER — Encounter: Payer: Self-pay | Admitting: Gynecology

## 2018-10-07 ENCOUNTER — Other Ambulatory Visit: Payer: Self-pay

## 2018-10-07 ENCOUNTER — Ambulatory Visit (INDEPENDENT_AMBULATORY_CARE_PROVIDER_SITE_OTHER): Payer: PRIVATE HEALTH INSURANCE | Admitting: Gynecology

## 2018-10-07 VITALS — BP 120/76 | Ht 61.5 in | Wt 162.0 lb

## 2018-10-07 DIAGNOSIS — Z01419 Encounter for gynecological examination (general) (routine) without abnormal findings: Secondary | ICD-10-CM | POA: Diagnosis not present

## 2018-10-07 MED ORDER — NORETHINDRONE ACET-ETHINYL EST 1-20 MG-MCG PO TABS: 1.0000 | ORAL_TABLET | Freq: Every day | ORAL | 4 refills | Status: AC

## 2018-10-07 NOTE — Addendum Note (Signed)
Addended by: Dayna Barker on: 10/07/2018 08:39 AM   Modules accepted: Orders

## 2018-10-07 NOTE — Progress Notes (Signed)
    Robin Potter Dec 06, 1970 [id number]        48 y.o.  [id number] for annual gynecologic exam.  Without gynecologic complaints  Past medical history,surgical history, problem list, medications, allergies, family history and social history were all reviewed and documented as reviewed in the EPIC chart.  ROS:  Performed with pertinent positives and negatives included in the history, assessment and plan.   Additional significant findings : None   Exam: Robin Potter assistant Vitals:   10/07/18 0759  BP: 120/76  Weight: 162 lb (73.5 kg)  Height: 5' 1.5" (1.562 m)   Body mass index is 30.11 kg/m.  General appearance:  Normal affect, orientation and appearance. Skin: Grossly normal HEENT: Without gross lesions.  No cervical or supraclavicular adenopathy. Thyroid normal.  Lungs:  Clear without wheezing, rales or rhonchi Cardiac: RR, without RMG Abdominal:  Soft, nontender, without masses, guarding, rebound, organomegaly or hernia Breasts:  Examined lying and sitting without masses, retractions, discharge or axillary adenopathy. Pelvic:  Ext, BUS, Vagina: Normal  Cervix: Normal.  Pap smear done  Uterus: Anteverted, normal size, shape and contour, midline and mobile nontender   Adnexa: Without masses or tenderness    Anus and perineum: Normal   Rectovaginal: Normal sphincter tone without palpated masses or tenderness.    Assessment/Plan:  48 y.o. [id number] female for annual gynecologic exam.  Without menses on continuous oral contraceptives  1. Continuous oral contraceptives for menstrual suppression and migraine suppression.  Discussed average age of menopause 48.  Risks of pills to include thrombosis such as stroke heart attack DVT.  Options to stop pills for now and monitor her cycles/symptoms using backup contraception, stopping pills for several weeks and checking an [facility] or continuing on the pills for another year reviewed.  At this point the patient prefers to stay on the pills and  I refilled her x1 year with 1/20 equivalent. 2. Mammography 06/2018.  Continue with annual mammography next year.  Breast exam normal today. 3. Pap smear 08/2016.  Pap smear done today.  History of LGSIL 2000 with normal Pap smears since. 4. Health maintenance.  Obtains routine lab work elsewhere.  Follow-up 1 year, sooner as needed.   [name] MD, 8:21 AM 10/07/2018

## 2018-10-07 NOTE — Patient Instructions (Signed)
Follow-up in 1 year for annual exam, sooner if any issues. 

## 2018-10-08 LAB — PAP IG W/ RFLX HPV ASCU

## 2019-03-05 ENCOUNTER — Encounter: Payer: Self-pay | Admitting: Gynecology
